# Patient Record
Sex: Female | Born: 1950 | Race: White | Hispanic: No | Marital: Married | Smoking: Never smoker
Health system: Southern US, Community
[De-identification: ages and names within clinical notes are randomized; demographics above are authoritative.]

## PROBLEM LIST (undated history)

## (undated) DIAGNOSIS — Z803 Family history of malignant neoplasm of breast: Secondary | ICD-10-CM

## (undated) DIAGNOSIS — Z8719 Personal history of other diseases of the digestive system: Secondary | ICD-10-CM

## (undated) DIAGNOSIS — C50919 Malignant neoplasm of unspecified site of unspecified female breast: Secondary | ICD-10-CM

## (undated) DIAGNOSIS — C50412 Malignant neoplasm of upper-outer quadrant of left female breast: Principal | ICD-10-CM

## (undated) DIAGNOSIS — N3010 Interstitial cystitis (chronic) without hematuria: Secondary | ICD-10-CM

## (undated) DIAGNOSIS — IMO0001 Reserved for inherently not codable concepts without codable children: Secondary | ICD-10-CM

## (undated) DIAGNOSIS — I10 Essential (primary) hypertension: Secondary | ICD-10-CM

## (undated) DIAGNOSIS — IMO0002 Reserved for concepts with insufficient information to code with codable children: Secondary | ICD-10-CM

## (undated) DIAGNOSIS — Z9889 Other specified postprocedural states: Secondary | ICD-10-CM

## (undated) DIAGNOSIS — R112 Nausea with vomiting, unspecified: Secondary | ICD-10-CM

## (undated) DIAGNOSIS — E785 Hyperlipidemia, unspecified: Secondary | ICD-10-CM

## (undated) HISTORY — PX: OTHER SURGICAL HISTORY: SHX169

## (undated) HISTORY — DX: Reserved for concepts with insufficient information to code with codable children: IMO0002

## (undated) HISTORY — DX: Malignant neoplasm of upper-outer quadrant of left female breast: C50.412

## (undated) HISTORY — DX: Hyperlipidemia, unspecified: E78.5

## (undated) HISTORY — PX: ABDOMINAL HYSTERECTOMY: SHX81

## (undated) HISTORY — DX: Family history of malignant neoplasm of breast: Z80.3

## (undated) HISTORY — PX: ESOPHAGOGASTRODUODENOSCOPY ENDOSCOPY: SHX5814

## (undated) HISTORY — DX: Malignant neoplasm of unspecified site of unspecified female breast: C50.919

## (undated) HISTORY — DX: Reserved for inherently not codable concepts without codable children: IMO0001

## (undated) HISTORY — PX: COLONOSCOPY W/ POLYPECTOMY: SHX1380

---

## 1998-05-25 ENCOUNTER — Other Ambulatory Visit: Admission: RE | Admit: 1998-05-25 | Discharge: 1998-05-25 | Payer: Self-pay | Admitting: Gynecology

## 1999-08-17 ENCOUNTER — Other Ambulatory Visit: Admission: RE | Admit: 1999-08-17 | Discharge: 1999-08-17 | Payer: Self-pay | Admitting: Gynecology

## 2000-08-05 ENCOUNTER — Other Ambulatory Visit: Admission: RE | Admit: 2000-08-05 | Discharge: 2000-08-05 | Payer: Self-pay | Admitting: Gynecology

## 2001-05-07 ENCOUNTER — Encounter: Payer: Self-pay | Admitting: Obstetrics and Gynecology

## 2001-05-07 ENCOUNTER — Encounter: Admission: RE | Admit: 2001-05-07 | Discharge: 2001-05-07 | Payer: Self-pay | Admitting: Obstetrics and Gynecology

## 2001-08-12 ENCOUNTER — Other Ambulatory Visit: Admission: RE | Admit: 2001-08-12 | Discharge: 2001-08-12 | Payer: Self-pay | Admitting: Gynecology

## 2001-11-27 ENCOUNTER — Encounter: Payer: Self-pay | Admitting: Gynecology

## 2001-12-02 ENCOUNTER — Inpatient Hospital Stay (HOSPITAL_COMMUNITY): Admission: RE | Admit: 2001-12-02 | Discharge: 2001-12-04 | Payer: Self-pay | Admitting: Gynecology

## 2004-08-18 ENCOUNTER — Ambulatory Visit (HOSPITAL_COMMUNITY): Admission: RE | Admit: 2004-08-18 | Discharge: 2004-08-18 | Payer: Self-pay | Admitting: Gastroenterology

## 2004-12-18 ENCOUNTER — Emergency Department (HOSPITAL_COMMUNITY): Admission: EM | Admit: 2004-12-18 | Discharge: 2004-12-18 | Payer: Self-pay | Admitting: Emergency Medicine

## 2005-03-08 ENCOUNTER — Other Ambulatory Visit: Admission: RE | Admit: 2005-03-08 | Discharge: 2005-03-08 | Payer: Self-pay | Admitting: Gynecology

## 2010-12-29 ENCOUNTER — Emergency Department (INDEPENDENT_AMBULATORY_CARE_PROVIDER_SITE_OTHER): Payer: 59

## 2010-12-29 ENCOUNTER — Emergency Department (HOSPITAL_BASED_OUTPATIENT_CLINIC_OR_DEPARTMENT_OTHER)
Admission: EM | Admit: 2010-12-29 | Discharge: 2010-12-30 | Disposition: A | Discharge status: Home / Self Care | Payer: 59 | Attending: Emergency Medicine | Admitting: Emergency Medicine

## 2010-12-29 DIAGNOSIS — W260XXA Contact with knife, initial encounter: Secondary | ICD-10-CM | POA: Insufficient documentation

## 2010-12-29 DIAGNOSIS — W261XXA Contact with sword or dagger, initial encounter: Secondary | ICD-10-CM | POA: Insufficient documentation

## 2010-12-29 DIAGNOSIS — E785 Hyperlipidemia, unspecified: Secondary | ICD-10-CM | POA: Insufficient documentation

## 2010-12-29 DIAGNOSIS — M79609 Pain in unspecified limb: Secondary | ICD-10-CM

## 2010-12-29 DIAGNOSIS — I10 Essential (primary) hypertension: Secondary | ICD-10-CM | POA: Insufficient documentation

## 2010-12-29 DIAGNOSIS — S61209A Unspecified open wound of unspecified finger without damage to nail, initial encounter: Secondary | ICD-10-CM | POA: Insufficient documentation

## 2010-12-29 DIAGNOSIS — Y92009 Unspecified place in unspecified non-institutional (private) residence as the place of occurrence of the external cause: Secondary | ICD-10-CM | POA: Insufficient documentation

## 2010-12-29 DIAGNOSIS — IMO0002 Reserved for concepts with insufficient information to code with codable children: Secondary | ICD-10-CM

## 2010-12-29 DIAGNOSIS — M7989 Other specified soft tissue disorders: Secondary | ICD-10-CM | POA: Insufficient documentation

## 2011-06-11 ENCOUNTER — Other Ambulatory Visit: Payer: Self-pay | Admitting: Gynecology

## 2012-08-26 ENCOUNTER — Other Ambulatory Visit: Payer: Self-pay | Admitting: Gastroenterology

## 2012-08-26 DIAGNOSIS — R1032 Left lower quadrant pain: Secondary | ICD-10-CM

## 2012-09-01 ENCOUNTER — Ambulatory Visit
Admission: RE | Admit: 2012-09-01 | Discharge: 2012-09-01 | Disposition: A | Discharge status: Home / Self Care | Payer: 59 | Source: Ambulatory Visit | Attending: Gastroenterology | Admitting: Gastroenterology

## 2012-09-01 DIAGNOSIS — R1032 Left lower quadrant pain: Secondary | ICD-10-CM

## 2012-09-01 MED ORDER — IOHEXOL 300 MG/ML  SOLN
100.0000 mL | Freq: Once | INTRAMUSCULAR | Status: AC | PRN
  Administered 2012-09-01: 100 mL via INTRAVENOUS

## 2014-08-26 ENCOUNTER — Emergency Department (HOSPITAL_COMMUNITY)
Admission: EM | Admit: 2014-08-26 | Discharge: 2014-08-26 | Disposition: A | Discharge status: Home / Self Care | Payer: 59 | Attending: Emergency Medicine | Admitting: Emergency Medicine

## 2014-08-26 ENCOUNTER — Encounter (HOSPITAL_COMMUNITY): Payer: Self-pay | Admitting: Emergency Medicine

## 2014-08-26 ENCOUNTER — Emergency Department (HOSPITAL_COMMUNITY): Payer: 59

## 2014-08-26 DIAGNOSIS — Z8742 Personal history of other diseases of the female genital tract: Secondary | ICD-10-CM | POA: Diagnosis not present

## 2014-08-26 DIAGNOSIS — I10 Essential (primary) hypertension: Secondary | ICD-10-CM | POA: Insufficient documentation

## 2014-08-26 DIAGNOSIS — R109 Unspecified abdominal pain: Secondary | ICD-10-CM | POA: Diagnosis present

## 2014-08-26 DIAGNOSIS — K5732 Diverticulitis of large intestine without perforation or abscess without bleeding: Secondary | ICD-10-CM | POA: Insufficient documentation

## 2014-08-26 DIAGNOSIS — Z90710 Acquired absence of both cervix and uterus: Secondary | ICD-10-CM | POA: Insufficient documentation

## 2014-08-26 HISTORY — DX: Essential (primary) hypertension: I10

## 2014-08-26 LAB — URINALYSIS, ROUTINE W REFLEX MICROSCOPIC
BILIRUBIN URINE: NEGATIVE
Glucose, UA: NEGATIVE mg/dL
Hgb urine dipstick: NEGATIVE
Ketones, ur: NEGATIVE mg/dL
Leukocytes, UA: NEGATIVE
Nitrite: NEGATIVE
PH: 5.5 (ref 5.0–8.0)
Protein, ur: NEGATIVE mg/dL
SPECIFIC GRAVITY, URINE: 1.018 (ref 1.005–1.030)
Urobilinogen, UA: 1 mg/dL (ref 0.0–1.0)

## 2014-08-26 LAB — CBC WITH DIFFERENTIAL/PLATELET
Basophils Absolute: 0 10*3/uL (ref 0.0–0.1)
Basophils Relative: 0 % (ref 0–1)
EOS ABS: 0 10*3/uL (ref 0.0–0.7)
EOS PCT: 0 % (ref 0–5)
HCT: 41.4 % (ref 36.0–46.0)
HEMOGLOBIN: 13.7 g/dL (ref 12.0–15.0)
LYMPHS PCT: 18 % (ref 12–46)
Lymphs Abs: 2.1 10*3/uL (ref 0.7–4.0)
MCH: 29 pg (ref 26.0–34.0)
MCHC: 33.1 g/dL (ref 30.0–36.0)
MCV: 87.7 fL (ref 78.0–100.0)
Monocytes Absolute: 1 10*3/uL (ref 0.1–1.0)
Monocytes Relative: 9 % (ref 3–12)
NEUTROS ABS: 8.1 10*3/uL — AB (ref 1.7–7.7)
Neutrophils Relative %: 73 % (ref 43–77)
Platelets: 249 10*3/uL (ref 150–400)
RBC: 4.72 MIL/uL (ref 3.87–5.11)
RDW: 13 % (ref 11.5–15.5)
WBC: 11.2 10*3/uL — ABNORMAL HIGH (ref 4.0–10.5)

## 2014-08-26 LAB — COMPREHENSIVE METABOLIC PANEL
ALT: 17 U/L (ref 0–35)
AST: 17 U/L (ref 0–37)
Albumin: 3.6 g/dL (ref 3.5–5.2)
Alkaline Phosphatase: 65 U/L (ref 39–117)
Anion gap: 7 (ref 5–15)
BUN: 12 mg/dL (ref 6–23)
CHLORIDE: 103 mmol/L (ref 96–112)
CO2: 29 mmol/L (ref 19–32)
Calcium: 8.8 mg/dL (ref 8.4–10.5)
Creatinine, Ser: 0.91 mg/dL (ref 0.50–1.10)
GFR, EST AFRICAN AMERICAN: 77 mL/min — AB (ref >90)
GFR, EST NON AFRICAN AMERICAN: 67 mL/min — AB (ref >90)
Glucose, Bld: 111 mg/dL — ABNORMAL HIGH (ref 70–99)
Potassium: 3.6 mmol/L (ref 3.5–5.1)
SODIUM: 139 mmol/L (ref 135–145)
Total Bilirubin: 1.5 mg/dL — ABNORMAL HIGH (ref 0.3–1.2)
Total Protein: 6.8 g/dL (ref 6.0–8.3)

## 2014-08-26 LAB — LIPASE, BLOOD: Lipase: 39 U/L (ref 11–59)

## 2014-08-26 MED ORDER — IOHEXOL 300 MG/ML  SOLN
80.0000 mL | Freq: Once | INTRAMUSCULAR | Status: AC | PRN
  Administered 2014-08-26: 80 mL via INTRAVENOUS

## 2014-08-26 MED ORDER — ONDANSETRON HCL 4 MG PO TABS: 4.0000 mg | ORAL_TABLET | Freq: Three times a day (TID) | ORAL | Status: DC | PRN

## 2014-08-26 MED ORDER — HYDROCODONE-ACETAMINOPHEN 5-325 MG PO TABS: 2.0000 | ORAL_TABLET | ORAL | Status: DC | PRN

## 2014-08-26 MED ORDER — ACETAMINOPHEN 325 MG PO TABS
650.0000 mg | ORAL_TABLET | Freq: Once | ORAL | Status: AC
  Administered 2014-08-26: 650 mg via ORAL
  Filled 2014-08-26: qty 2

## 2014-08-26 MED ORDER — METRONIDAZOLE 500 MG PO TABS
500.0000 mg | ORAL_TABLET | Freq: Once | ORAL | Status: AC
  Administered 2014-08-26: 500 mg via ORAL
  Filled 2014-08-26: qty 1

## 2014-08-26 MED ORDER — CIPROFLOXACIN HCL 500 MG PO TABS
500.0000 mg | ORAL_TABLET | Freq: Once | ORAL | Status: AC
  Administered 2014-08-26: 500 mg via ORAL
  Filled 2014-08-26: qty 1

## 2014-08-26 MED ORDER — CIPROFLOXACIN HCL 500 MG PO TABS: 500.0000 mg | ORAL_TABLET | Freq: Two times a day (BID) | ORAL | Status: AC

## 2014-08-26 MED ORDER — ONDANSETRON HCL 4 MG PO TABS: 4.0000 mg | ORAL_TABLET | Freq: Four times a day (QID) | ORAL | Status: DC

## 2014-08-26 MED ORDER — METRONIDAZOLE 500 MG PO TABS
500.0000 mg | ORAL_TABLET | Freq: Three times a day (TID) | ORAL | Status: DC
Start: 2014-08-26 — End: 2015-06-29

## 2014-08-26 MED ORDER — IOHEXOL 300 MG/ML  SOLN
25.0000 mL | INTRAMUSCULAR | Status: AC
  Administered 2014-08-26: 25 mL via ORAL

## 2014-08-26 NOTE — ED Notes (Signed)
Pt still not requesting pain medicine.  RN encouraged her to let us know if she changes her mind.

## 2014-08-26 NOTE — ED Notes (Signed)
Pt c/o RLQ pain x 3 days; pt sent from PCP pt had labs with her from PCP

## 2014-08-26 NOTE — ED Notes (Signed)
Pt sts she has a hx of diverticulitis, but only noted on left side in previous colonoscopy.

## 2014-08-26 NOTE — ED Notes (Signed)
Ct notified pt done with contrast.  Waiting on lab results, will come to pick up when resulted

## 2014-08-26 NOTE — ED Provider Notes (Signed)
CSN: [id number]     Arrival date & time 08/26/14  1621 History   First MD Initiated Contact with Patient 08/26/14 1900     Chief Complaint  Patient presents with  . Abdominal Pain     (Consider location/radiation/quality/duration/timing/severity/associated sxs/prior Treatment) HPI 64 year old female past history of total hysterectomy, interstitial cystitis, diverticulitis who presents to ED from clinic for further evaluation of 3 days of right lower quadrant pain. Patient reports pain has been intermittent over the past 2 days and then became constant. Patient reports pain is achy and rated as moderate. Palpation worsens pain and rest improves it. Pain is also located in the right mid and upper quadrants however her most significant pain is in the right lower quadrant. Pain seems to radiate to her back. She has taken nothing for treatment over-the-counter. Patient went to her PCP today who did labs which were notable for 13,000 white count. PCP was concerned about appendicitis and sent patient to ED for further evaluation. Patient denies fever, chills, urinary symptoms.    History reviewed. No pertinent past medical history. History reviewed. No pertinent past surgical history. History reviewed. No pertinent family history. History  Substance Use Topics  . Smoking status: Never Smoker   . Smokeless tobacco: Not on file  . Alcohol Use: Yes     Comment: occ   OB History    No data available     Review of Systems  Constitutional: Negative for fever and chills.  HENT: Negative for congestion, rhinorrhea and sore throat.   Eyes: Negative for visual disturbance.  Respiratory: Negative for cough and shortness of breath.   Cardiovascular: Negative for chest pain, palpitations and leg swelling.  Gastrointestinal: Positive for abdominal pain. Negative for nausea, vomiting, diarrhea and constipation.  Genitourinary: Negative for dysuria, hematuria, vaginal bleeding and vaginal discharge.   Musculoskeletal: Negative for back pain and neck pain.  Skin: Negative for rash.  Neurological: Negative for weakness and headaches.  All other systems reviewed and are negative.     Allergies  Sulfa antibiotics  Home Medications   Prior to Admission medications   Not on File   BP 144/81 mmHg  Pulse 94  Temp(Src) 98.2 F (36.8 C) (Oral)  Resp 20  Ht 5\' 8"  (1.727 m)  Wt 163 lb (73.936 kg)  BMI 24.79 kg/m2  SpO2 100% Physical Exam  Constitutional: She is oriented to person, place, and time. She appears well-developed and well-nourished. No distress.  HENT:  Head: Normocephalic and atraumatic.  Eyes: Conjunctivae are normal.  Neck: Normal range of motion.  Cardiovascular: Normal rate, regular rhythm, normal heart sounds and intact distal pulses.   No murmur heard. Pulmonary/Chest: Effort normal and breath sounds normal. No respiratory distress. She has no wheezes. She has no rales. She exhibits no tenderness.  Abdominal: Soft. Bowel sounds are normal. She exhibits no distension and no mass. There is tenderness in the right upper quadrant, right lower quadrant and periumbilical area. There is guarding and tenderness at McBurney's point. There is no rigidity, no rebound and negative Murphy's sign.  RLQ > RUQ  Musculoskeletal: Normal range of motion.  Neurological: She is alert and oriented to person, place, and time. No cranial nerve deficit.  Skin: Skin is warm and dry.  Psychiatric: She has a normal mood and affect.  Nursing note and vitals reviewed.   ED Course  Procedures (including critical care time) Labs Review Labs Reviewed  CBC WITH DIFFERENTIAL/PLATELET - Abnormal; Notable for the following:  WBC 11.2 (*)    Neutro Abs 8.1 (*)    All other components within normal limits  COMPREHENSIVE METABOLIC PANEL - Abnormal; Notable for the following:    Glucose, Bld 111 (*)    Total Bilirubin 1.5 (*)    GFR calc non Af Amer 67 (*)    GFR calc Af Amer 77 (*)     All other components within normal limits  URINE CULTURE  LIPASE, BLOOD  URINALYSIS, ROUTINE W REFLEX MICROSCOPIC    Imaging Review Ct Abdomen Pelvis W Contrast  08/26/2014   CLINICAL DATA:  Acute onset of upper abdominal pain and right-sided abdominal pain for 3 days. Initial encounter.  EXAM: CT ABDOMEN AND PELVIS WITH CONTRAST  TECHNIQUE: Multidetector CT imaging of the abdomen and pelvis was performed using the standard protocol following bolus administration of intravenous contrast.  CONTRAST:  32mL OMNIPAQUE IOHEXOL 300 MG/ML  SOLN  COMPARISON:  CT of the abdomen and pelvis from 09/11/2012  FINDINGS: The visualized lung bases are clear. There appears to be a 2.2 cm nodule at the left breast just above the level of the nipple. A tiny hiatal hernia is noted.  Scattered hypodensities are seen in the liver. Some of these appear to reflect cysts, though others are of only slightly decreased attenuation. As these appear stable in size from 2014, these likely reflect small hemangiomas. The spleen is unremarkable in appearance. The gallbladder is within normal limits. The pancreas and adrenal glands are unremarkable.  An apparent stable small 1.0 cm bilobed cyst is noted at the anterior aspect of the left kidney. The kidneys are otherwise unremarkable. Minimal nonspecific perinephric stranding is noted bilaterally. There is no evidence of hydronephrosis. No renal or ureteral stones are seen.  The small bowel is unremarkable in appearance. The stomach is within normal limits. No acute vascular abnormalities are seen. Minimal calcification is seen along the abdominal aorta and its branches.  The appendix is normal in caliber, without evidence for appendicitis.  Focal soft tissue inflammation is noted along the proximal transverse colon, with associated inflamed diverticulum and wall thickening, compatible with acute diverticulitis. Scattered diverticulosis involves the distal ascending, transverse, descending and  proximal sigmoid colon.  The bladder is mildly distended and grossly unremarkable. The patient is status post hysterectomy. No suspicious adnexal masses are seen. No inguinal lymphadenopathy is seen.  No acute osseous abnormalities are identified.  IMPRESSION: 1. Acute diverticulitis at the proximal transverse colon, with associated wall thickening and soft tissue inflammation. No evidence of perforation or abscess formation at this time. 2. 2.2 cm nodule at the left breast, about the 12 o'clock region. Would correlate with recent mammogram, and consider follow-up mammogram as deemed clinically appropriate. 3. Scattered diverticulosis along the distal ascending, transverse, descending and proximal sigmoid colon. 4. Stable hypodensities within the liver. These appear to reflect small hemangiomas and cysts. Stable small left renal bilobed cyst also seen. 5. Tiny hiatal hernia seen.   Electronically Signed   By: Garald Balding M.D.   On: 08/26/2014 21:22     EKG Interpretation None      MDM   Final diagnoses:  None    ALAIJAH GIBLER is a 64 y.o. female with H&P as above. On arrival patient is him an enema stable and in no apparent distress. Her abdominal exam is as above notable for tenderness to palpation in the right lower and right upper quadrants with more intense pain appreciated in the right lower quadrant. Patient will received screening  labs and CT of her abdomen for further evaluation. Patient declines pain or nausea medication at this time. Patient found to have uncomplicated diverticulitis without signs of perforation or abscess. She was given first dose of Flagyl and Cipro while in the ED. Repeat abdominal exam remains benign. Patient given prescriptions for antibiotics, pain and nausea medication as needed. She is advised follow closely with her PCP.    Clinical Impression: 1. Diverticulitis of large intestine without perforation or abscess without bleeding     Disposition:  Discharge  Condition: Good  I have discussed the results, Dx and Tx plan with the pt(& family if present). He/she/they expressed understanding and agree(s) with the plan. Discharge instructions discussed at great length. Strict return precautions discussed and pt &/or family have verbalized understanding of the instructions. No further questions at time of discharge.    Discharge Medication List as of 08/26/2014 10:09 PM    START taking these medications   Details  ciprofloxacin (CIPRO) 500 MG tablet Take 1 tablet (500 mg total) by mouth 2 (two) times daily., Starting 08/26/2014, Until Sun 09/05/14, Print    HYDROcodone-acetaminophen (NORCO/VICODIN) 5-325 MG per tablet Take 2 tablets by mouth every 4 (four) hours as needed., Starting 08/26/2014, Until Discontinued, Print    metroNIDAZOLE (FLAGYL) 500 MG tablet Take 1 tablet (500 mg total) by mouth 3 (three) times daily., Starting 08/26/2014, Until Discontinued, Print    ondansetron (ZOFRAN) 4 MG tablet Take 1 tablet (4 mg total) by mouth every 6 (six) hours., Starting 08/26/2014, Until Discontinued, Print        Follow Up: [facility]     [address] [place] [address] [phone]  Follow up with your primary doctor in next 2-3 days for recheck. If needed, To establish primary care, call above   Pt seen in conjunction with Dr. Corky Sox, [place] [facility] [facility] Resident - PGY-2     Kirstie Peri, MD 37/04/88 [id number]  Delora Fuel, MD 94/50/38 [id number]

## 2014-08-26 NOTE — Discharge Instructions (Signed)
Diverticulitis °Diverticulitis is when small pockets that have formed in your colon (large intestine) become infected or swollen. °HOME CARE °· Follow your doctor's instructions. °· Follow a special diet if told by your doctor. °· When you feel better, your doctor may tell you to change your diet. You may be told to eat a lot of fiber. Fruits and vegetables are good sources of fiber. Fiber makes it easier to poop (have bowel movements). °· Take supplements or probiotics as told by your doctor. °· Only take medicines as told by your doctor. °· Keep all follow-up visits with your doctor. °GET HELP IF: °· Your pain does not get better. °· You have a hard time eating food. °· You are not pooping like normal. °GET HELP RIGHT AWAY IF: °· Your pain gets worse. °· Your problems do not get better. °· Your problems suddenly get worse. °· You have a fever. °· You keep throwing up (vomiting). °· You have bloody or black, tarry poop (stool). °MAKE SURE YOU:  °· Understand these instructions. °· Will watch your condition. °· Will get help right away if you are not doing well or get worse. °Document Released: 12/26/2007 Document Revised: 07/14/2013 Document Reviewed: 06/03/2013 °ExitCare® Patient Information ©2015 [employer]. This information is not intended to replace advice given to you by your health care provider. Make sure you discuss any questions you have with your health care provider. ° °

## 2014-08-28 LAB — URINE CULTURE

## 2015-03-09 ENCOUNTER — Other Ambulatory Visit: Payer: Self-pay | Admitting: Gastroenterology

## 2015-06-21 ENCOUNTER — Other Ambulatory Visit: Payer: Self-pay | Admitting: Radiology

## 2015-06-23 ENCOUNTER — Telehealth: Payer: Self-pay | Admitting: *Deleted

## 2015-06-23 NOTE — Telephone Encounter (Signed)
Left vm for pt to return call regarding [place] on 06/29/15. Contact information provided.

## 2015-06-24 ENCOUNTER — Encounter: Payer: Self-pay | Admitting: *Deleted

## 2015-06-24 ENCOUNTER — Telehealth: Payer: Self-pay | Admitting: *Deleted

## 2015-06-24 DIAGNOSIS — C50412 Malignant neoplasm of upper-outer quadrant of left female breast: Secondary | ICD-10-CM

## 2015-06-24 HISTORY — DX: Malignant neoplasm of upper-outer quadrant of left female breast: C50.412

## 2015-06-24 NOTE — Telephone Encounter (Signed)
Confirmed [facility] for 06/29/15 at 0830 .  Instructions and contact information given.

## 2015-06-28 NOTE — Progress Notes (Addendum)
[name]  [phone] [phone]  Clinic New Consult Note   Patient Care Team: Leighton Ruff, MD as PCP - General (Family Medicine) Excell Seltzer, MD as Consulting Physician (General Surgery) Truitt Merle, MD as Consulting Physician (Hematology) Arloa Koh, MD as Consulting Physician (Radiation Oncology) Sylvan Cheese, NP as Nurse Practitioner (Hematology and Oncology) 06/29/2015  CHIEF COMPLAINTS/PURPOSE OF CONSULTATION:  Newly diagnosed left breast cancer   Oncology History   Breast cancer of upper-outer quadrant of left female breast [facility])   Staging form: Breast, AJCC 7th Edition     Clinical: Stage IIA (T2, N0, cM0(i+)) - Unsigned       Breast cancer of upper-outer quadrant of left female breast ([place])   06/21/2015 Initial Diagnosis Breast cancer of upper-outer quadrant of left female breast ([place])   06/21/2015 Initial Biopsy Left breast core needle biopsy showed invasive ductal carcinoma, grade 2, and DCIS   06/21/2015 Receptors her2 ER 100% positive, PR 100% positive, HER-2 negative, Ki-67 2%    Mammogram calcification on Mammogram, and a 3cm mass at 3:00 position of left breast on [place], axilla was negative on [place]     HISTORY OF PRESENTING ILLNESS:  Maria Larson 64 y.o. female is here because of her recently diagnosed left breast cancer. She is accompanied by her husband to our multidisciplinary breast clinic today.  The cancer was discovered by screening mammogram. She had ultrasound of breast for follow-up a left breast cyst 6 months ago which was negative except a 2.4 cm cyst. Her mammogram on 06/13/2015 showed a a new area of calcification and ultrasound showed 2.2 cm irregular solid mass in the left breast at 3 clock position, which is new and suspicious for malignancy. She underwent ultrasound-guided core needle biopsy on 06/21/2015 which showed grade 2 invasive ductal carcinoma. ER/PR 100% positive, HER-2  negative.  She had no palpable breast mass before the biopsy. She tolerated the biopsy well, no complications. She feels well overall, denies any significant pain, dyspnea, GI or other symptoms. She is a current Pharmacist, hospital, still works full-time, she is active, bike 3-4 time a week for 20 months.   She had and it etopical pregnancy, was not able to conceive after infertility workup and treatment. She lives with her husband, they have a adopted son who is 52.   MEDICAL HISTORY:  Past Medical History  Diagnosis Date  . Hypertension   . Breast cancer of upper-outer quadrant of left female breast ([place]) 06/24/2015  . Breast cancer of upper-outer quadrant of left female breast ([place]) 06/24/2015  . Breast cancer ([place])   . Hyperlipemia     SURGICAL HISTORY: Past Surgical History  Procedure Laterality Date  . Abdominal hysterectomy    . Laproscopy      Multiple laparoscopy for infertility issue    GYN HISTORY  Menarchal: 16 LMP: hysterectomy at 50  Contraceptive: none  HRT: yes, 14 years, stopped recently  G1P0:   SOCIAL HISTORY: Social History   Social History  . Marital Status: Married    Spouse Name: N/A  . Number of Children: One adopted sone who is 20   . Years of Education: N/A   Occupational History  . Kindergarten teacher   Social History Main Topics  . Smoking status: Never Smoker   . Smokeless tobacco: Not on file  . Alcohol Use: Yes     Comment: social   . Drug Use: No  . Sexual Activity: Not on file   Other Topics  Concern  . Not on file   Social History Narrative    FAMILY HISTORY: Family History  Problem Relation Age of Onset  . Lung cancer Father   . Breast cancer Paternal Aunt   . Colon cancer Paternal Aunt   . Breast cancer Paternal Aunt   . Breast cancer Paternal Aunt     ALLERGIES:  is allergic to sulfa antibiotics.  MEDICATIONS:  Current Outpatient Prescriptions  Medication Sig Dispense Refill  . aspirin 81 MG tablet Take 81 mg by mouth  daily.    . Calcium Carb-Cholecalciferol (CALCIUM 600+D) 600-800 MG-UNIT TABS Take 1 tablet by mouth daily.    Marland Kitchen conjugated estrogens (PREMARIN) vaginal cream Place 1 Applicatorful vaginally once a week.    . Meth-Hyo-M Bl-Na Phos-Ph Sal (URIBEL PO) Take 1 tablet by mouth 3 (three) times daily as needed.    . Multiple Vitamin (MULTIVITAMIN) tablet Take 1 tablet by mouth daily.    Marland Kitchen omeprazole (PRILOSEC) 20 MG capsule Take 20 mg by mouth daily.    . ramipril (ALTACE) 10 MG capsule Take 10 mg by mouth daily.    . rosuvastatin (CRESTOR) 10 MG tablet Take 10 mg by mouth daily.     No current facility-administered medications for this visit.    REVIEW OF SYSTEMS:   Constitutional: Denies fevers, chills or abnormal night sweats Eyes: Denies blurriness of vision, double vision or watery eyes Ears, nose, mouth, throat, and face: Denies mucositis or sore throat Respiratory: Denies cough, dyspnea or wheezes Cardiovascular: Denies palpitation, chest discomfort or lower extremity swelling Gastrointestinal:  Denies nausea, heartburn or change in bowel habits Skin: Denies abnormal skin rashes Lymphatics: Denies new lymphadenopathy or easy bruising Neurological:Denies numbness, tingling or new weaknesses Behavioral/Psych: Mood is stable, no new changes  All other systems were reviewed with the patient and are negative.  PHYSICAL EXAMINATION: ECOG PERFORMANCE STATUS: 0 - Asymptomatic  Filed Vitals:   06/29/15 0901  BP: 156/88  Pulse: 97  Temp: 97.9 F (36.6 C)  Resp: 18   Filed Weights   06/29/15 0901  Weight: 168 lb 4.8 oz (76.34 kg)    GENERAL:alert, no distress and comfortable SKIN: skin color, texture, turgor are normal, no rashes or significant lesions EYES: normal, conjunctiva are pink and non-injected, sclera clear OROPHARYNX:no exudate, no erythema and lips, buccal mucosa, and tongue normal  NECK: supple, thyroid normal size, non-tender, without nodularity LYMPH:  no palpable  lymphadenopathy in the cervical, axillary or inguinal LUNGS: clear to auscultation and percussion with normal breathing effort HEART: regular rate & rhythm and no murmurs and no lower extremity edema ABDOMEN:abdomen soft, non-tender and normal bowel sounds Musculoskeletal:no cyanosis of digits and no clubbing  PSYCH: alert & oriented x 3 with fluent speech NEURO: no focal motor/sensory deficits Breasts: Breast inspection showed them to be symmetrical with no nipple discharge. Palpation of the breasts and axilla revealed no obvious mass that I could appreciate.   LABORATORY DATA:  I have reviewed the data as listed Lab Results  Component Value Date   WBC 6.5 06/29/2015   HGB 14.5 06/29/2015   HCT 43.4 06/29/2015   MCV 88.6 06/29/2015   PLT 287 06/29/2015    Recent Labs  08/26/14 2000 06/29/15 0849  NA 139 141  K 3.6 4.1  CL 103  --   CO2 29 24  GLUCOSE 111* 114  BUN 12 17.5  CREATININE 0.91 0.9  CALCIUM 8.8 9.5  GFRNONAA 67*  --   GFRAA 77*  --  PROT 6.8 6.9  ALBUMIN 3.6 3.7  AST 17 21  ALT 17 20  ALKPHOS 65 70  BILITOT 1.5* 1.69*   PATHOLOGY REPORT: Diagnosis 06/21/2015     Breast, left, needle core biopsy - INVASIVE DUCTAL CARCINOMA. - DUCTAL CARCINOMA IN SITU. - SEE COMMENT. Microscopic Comment Although definitive grading of breast carcinoma is best done on excision, the features of the invasive tumor from the left needle core biopsy are compatible with a grade 2 breast carcinoma. Breast prognostic markers will be performed and reported in an addendum. Findings are called to [facility]) on 06/22/2015. Dr. Donato Heinz has seen this case in consultation with agreement. (RH:ecj 06/22/2015)  Results: IMMUNOHISTOCHEMICAL AND MORPHOMETRIC ANALYSIS PERFORMED MANUALLY Estrogen Receptor: 100%, POSITIVE, STRONG STAINING INTENSITY Progesterone Receptor: 100%, POSITIVE, STRONG STAINING INTENSITY Proliferation Marker Ki67:  2%  Results: HER2 - NEGATIVE RATIO OF HER2/CEP17 SIGNALS 1.27 AVERAGE HER2 COPY NUMBER PER CELL 2.80   RADIOGRAPHIC STUDIES: I have personally reviewed the radiological images as listed and agreed with the findings in the report. No results found.  ASSESSMENT & PLAN:  64 year old Caucasian postmenopausal female, with past medical history of hypertension,dyslipidemia, presented with screening discovered left breast cancer.  1. 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 -She is a candidate for breast conservation surgery. She was seen by breast surgeon Dr. Excell Seltzer today, who recommends lumpectomy. -Given her positive family history of breast cancer, we recommend her to undergo genetic testing to ruled out inheritable breast cancer -Given her strong ER and PR positivity, low Ki-67, she may not need adjuvant chemotherapy -I recommend further motor testing on her surgical tumor sample. If her final surgical path reveals negative lymph nodes, I recommend Oncotype DX test. If she has positive lymph nodes, I recommend mammaprint genomic testing, to everted her risk of cancer recurrence and benefit of adjuvant chemotherapy. Written material about Oncotype DX test will give into her to review.  -Given her strongly positive ER and PR, I do recommend antiestrogen therapy. She is postmenopausal, I would recommend a aromatase inhibitor for total 5-10 years. -She will likely benefit from breast radiation if she undergo lumpectomy to decrease the risk of breast cancer, she was seen by a radiation oncologist Dr. Valere Dross today -We discussed breast cancer surveillance after she completes treatment, Including annual mammogram, breast exam every 6-12 months. -the above was discussed with patient and her husband in  great details, she agrees with the plan.  2. Hypertension and dyslipidemia -She will continue follow-up with her permit care physician  Plan -genetic referral -She will likely have breast surgery soon  -I'll review her surgical pathology report. If node-negative, will sent Oncotype. If 1-2 nodes positive, we'll consider mammaprint  -I'll see her back in 3 weeks after her surgery to review her molecular testing results and determine on adjuvant chemotherapy   All questions were answered. The patient knows to call the clinic with any problems, questions or concerns. I spent 55 minutes counseling the patient face to face. The total time spent in the appointment was 60 minutes and more than 50% was on counseling.     Truitt Merle, MD 06/29/2015 2:13 PM

## 2015-06-29 ENCOUNTER — Other Ambulatory Visit: Payer: 59

## 2015-06-29 ENCOUNTER — Encounter: Payer: Self-pay | Admitting: Physical Therapy

## 2015-06-29 ENCOUNTER — Ambulatory Visit (HOSPITAL_BASED_OUTPATIENT_CLINIC_OR_DEPARTMENT_OTHER): Payer: 59 | Admitting: Hematology

## 2015-06-29 ENCOUNTER — Ambulatory Visit
Admission: RE | Admit: 2015-06-29 | Discharge: 2015-06-29 | Disposition: A | Discharge status: Home / Self Care | Payer: 59 | Source: Ambulatory Visit | Attending: Radiation Oncology | Admitting: Radiation Oncology

## 2015-06-29 ENCOUNTER — Encounter: Payer: Self-pay | Admitting: Skilled Nursing Facility1

## 2015-06-29 ENCOUNTER — Encounter: Payer: Self-pay | Admitting: Hematology

## 2015-06-29 ENCOUNTER — Ambulatory Visit: Payer: 59 | Attending: General Surgery | Admitting: Physical Therapy

## 2015-06-29 ENCOUNTER — Other Ambulatory Visit: Payer: Self-pay | Admitting: General Surgery

## 2015-06-29 ENCOUNTER — Encounter: Payer: Self-pay | Admitting: Nurse Practitioner

## 2015-06-29 ENCOUNTER — Other Ambulatory Visit (HOSPITAL_BASED_OUTPATIENT_CLINIC_OR_DEPARTMENT_OTHER): Payer: 59

## 2015-06-29 VITALS — BP 156/88 | HR 97 | Temp 97.9°F | Resp 18 | Ht 68.0 in | Wt 168.3 lb

## 2015-06-29 DIAGNOSIS — R293 Abnormal posture: Secondary | ICD-10-CM | POA: Insufficient documentation

## 2015-06-29 DIAGNOSIS — I10 Essential (primary) hypertension: Secondary | ICD-10-CM

## 2015-06-29 DIAGNOSIS — C50412 Malignant neoplasm of upper-outer quadrant of left female breast: Secondary | ICD-10-CM

## 2015-06-29 DIAGNOSIS — C50812 Malignant neoplasm of overlapping sites of left female breast: Secondary | ICD-10-CM

## 2015-06-29 DIAGNOSIS — C50912 Malignant neoplasm of unspecified site of left female breast: Secondary | ICD-10-CM

## 2015-06-29 DIAGNOSIS — E755 Other lipid storage disorders: Secondary | ICD-10-CM

## 2015-06-29 DIAGNOSIS — Z170 Estrogen receptor positive status [ER+]: Secondary | ICD-10-CM | POA: Diagnosis not present

## 2015-06-29 LAB — COMPREHENSIVE METABOLIC PANEL
ALT: 20 U/L (ref 0–55)
ANION GAP: 11 meq/L (ref 3–11)
AST: 21 U/L (ref 5–34)
Albumin: 3.7 g/dL (ref 3.5–5.0)
Alkaline Phosphatase: 70 U/L (ref 40–150)
BILIRUBIN TOTAL: 1.69 mg/dL — AB (ref 0.20–1.20)
BUN: 17.5 mg/dL (ref 7.0–26.0)
CALCIUM: 9.5 mg/dL (ref 8.4–10.4)
CHLORIDE: 107 meq/L (ref 98–109)
CO2: 24 mEq/L (ref 22–29)
CREATININE: 0.9 mg/dL (ref 0.6–1.1)
EGFR: 70 mL/min/{1.73_m2} — AB (ref >90)
Glucose: 114 mg/dl (ref 70–140)
Potassium: 4.1 mEq/L (ref 3.5–5.1)
Sodium: 141 mEq/L (ref 136–145)
Total Protein: 6.9 g/dL (ref 6.4–8.3)

## 2015-06-29 LAB — CBC WITH DIFFERENTIAL/PLATELET
BASO%: 0.5 % (ref 0.0–2.0)
BASOS ABS: 0 10*3/uL (ref 0.0–0.1)
EOS ABS: 0 10*3/uL (ref 0.0–0.5)
EOS%: 0.5 % (ref 0.0–7.0)
HEMATOCRIT: 43.4 % (ref 34.8–46.6)
HGB: 14.5 g/dL (ref 11.6–15.9)
LYMPH#: 1.6 10*3/uL (ref 0.9–3.3)
LYMPH%: 24.9 % (ref 14.0–49.7)
MCH: 29.6 pg (ref 25.1–34.0)
MCHC: 33.4 g/dL (ref 31.5–36.0)
MCV: 88.6 fL (ref 79.5–101.0)
MONO#: 0.5 10*3/uL (ref 0.1–0.9)
MONO%: 7.5 % (ref 0.0–14.0)
NEUT#: 4.3 10*3/uL (ref 1.5–6.5)
NEUT%: 66.6 % (ref 38.4–76.8)
PLATELETS: 287 10*3/uL (ref 145–400)
RBC: 4.9 10*6/uL (ref 3.70–5.45)
RDW: 13.3 % (ref 11.2–14.5)
WBC: 6.5 10*3/uL (ref 3.9–10.3)

## 2015-06-29 NOTE — Progress Notes (Addendum)
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 female who is  Seen today at the breast  multidisciplinary clinic through the courtesy of Dr. 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 At the time of a CT scan of her abdomen for evaluation of upper abdominal discomfort on 08/26/2014 she was felt to have a 2.2 cm nodule in the 12:00 position within the left breast. Follow-up mammography at [facility] on  09/07/2014 showed a 2.4 cm cyst within the left breast and follow-up mammography was recommended. Follow-up mammography on 06/13/2015 showed a 2.2 cm solid mass within left breast at 3:00 . Mammography showed an adjacent area of calcifications within left breast at 5:00, anterior depth. A biopsy on 06/21/2015 was diagnostic for invasive ductal carcinoma along with DCIS.  The invasive disease was felt to be grade 2.  The invasive disease was ER positive at 100%, PR positive at 100% with a Ki-67 of 2%.  HER-2/neu was negative.  Of note is that she does give a 13 year history of hormone replacement therapy which was just discontinued.  Her mother took DES during her pregnancy.  She is seen today with Dr. Excell Seltzer and Dr. Burr Medico.  PREVIOUS RADIATION THERAPY: No   PAST MEDICAL HISTORY:  has a past medical history of Hypertension; Breast cancer of upper-outer quadrant of left female breast ([place]) (06/24/2015); Breast cancer of upper-outer quadrant of left female breast ([place]) (06/24/2015); Breast cancer ([place]); and Hyperlipemia.     PAST SURGICAL HISTORY:  Past Surgical  History  Procedure Laterality Date  . Abdominal hysterectomy    . Laproscopy      Multiple laparoscopy for infertility issue     FAMILY HISTORY: family history includes Breast cancer in her paternal aunt, paternal aunt, and paternal aunt; Colon cancer in her paternal aunt; Lung cancer in her father.   Her father died of metastatic lung cancer at age 56.  Her mother died from complications of Alzheimer's disease at 35. 3 paternal aunts were diagnosed with breast cancer after age 20.   SOCIAL HISTORY:  reports that she has never smoked. She does not have any smokeless tobacco history on file. She reports that she drinks alcohol. She reports that she does not use illicit drugs.   Married , one adopted child.  She works as a Print production planner  and graduated from [employer]: Sulfa antibiotics   MEDICATIONS:  Current Outpatient Prescriptions  Medication Sig Dispense Refill  . aspirin 81 MG tablet Take 81 mg by mouth daily.    . Calcium Carb-Cholecalciferol (CALCIUM 600+D) 600-800 MG-UNIT TABS Take 1 tablet by mouth daily.    Marland Kitchen conjugated estrogens (PREMARIN) vaginal cream Place 1 Applicatorful vaginally once a week.    . Meth-Hyo-M Bl-Na Phos-Ph Sal (URIBEL PO) Take 1 tablet by mouth 3 (three) times daily as needed.    . Multiple Vitamin (MULTIVITAMIN) tablet Take 1 tablet by mouth daily.    Marland Kitchen omeprazole (PRILOSEC) 20 MG capsule Take 20 mg  by mouth daily.    . ramipril (ALTACE) 10 MG capsule Take 10 mg by mouth daily.    . rosuvastatin (CRESTOR) 10 MG tablet Take 10 mg by mouth daily.     No current facility-administered medications for this encounter.     REVIEW OF SYSTEMS:  Pertinent items are noted in HPI.    PHYSICAL EXAM:  Alert and oriented 64 year old female appearing younger than her stated age. Wt Readings from Last 3 Encounters:  06/29/15 168 lb 4.8 oz (76.34 kg)  08/26/14 163 lb (73.936 kg)   Temp Readings from Last 3 Encounters:  06/29/15 97.9 F (36.6 C) Oral   08/26/14 99.2 F (37.3 C) Oral   BP Readings from Last 3 Encounters:  06/29/15 156/88  08/26/14 138/71   Pulse Readings from Last 3 Encounters:  06/29/15 97  08/26/14 90    Head and neck examination: Grossly unremarkable.  Nodes: Without palpable cervical, supraclavicular, or axillary lymphadenopathy. Breast: there is a punctate biopsy wound at approximately 3:00 along the lateral aspect of the left breast.  No discreet masses are appreciated.  Right breast without masses or lesions.  Extremities: Without edema.    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 We discussed local management options which include mastectomy versus partial mastectomy along with a sentinel lymph node biopsy.  We discussed hypofractionated treatment versus standard fractionation. Considering her breast size she may or may not be a candidate for hypofractionated treatment.  We discussed the potential acute and late toxicities of radiation therapy.  We discussed deep deep inspiration breath-hold technology to avoid cardiac irradiation.  With her family history, she will undergo genetic counseling.  She will then decide on her choice of surgery.   PLAN:  As discussed above.  She can be seen by Dr. Gery Pray for postoperative follow-up.   I spent 30  minutes face to face with the patient and more than 50% of that time was spent in counseling and/or coordination of care.

## 2015-06-29 NOTE — Therapy (Signed)
[place], [place], [address] Phone: [phone]   Fax:  [phone]  Physical Therapy Evaluation  Patient Details  Name: Maria Larson MRN: [id number] Date of Birth: 1951-06-03 Referring Provider: Dr. Excell Seltzer  Encounter Date: 06/29/2015      PT End of Session - 06/29/15 1303    Visit Number 1   Number of Visits 1   PT Start Time 1100   PT Stop Time 1120  Also saw pt from 1150-1205   PT Time Calculation (min) 20 min   Activity Tolerance Patient tolerated treatment well   Behavior During Therapy [facility] for tasks assessed/performed      Past Medical History  Diagnosis Date  . Hypertension   . Breast cancer of upper-outer quadrant of left female breast ([place]) 06/24/2015  . Breast cancer of upper-outer quadrant of left female breast ([place]) 06/24/2015  . Breast cancer ([place])   . Hyperlipemia     Past Surgical History  Procedure Laterality Date  . Abdominal hysterectomy    . Laproscopy      Multiple laparoscopy for infertility issue    There were no vitals filed for this visit.  Visit Diagnosis:  Carcinoma of upper-outer quadrant of left female breast [facility]) - Plan: PT plan of care cert/re-cert  Abnormal posture - Plan: PT plan of care cert/re-cert      Subjective Assessment - 06/29/15 1256    Subjective Patient was seen today for a baseline assessment of her newly diagnosed left breast cancer.   Patient is accompained by: Family member   Pertinent History Patient was diagnosed on 06/13/15 with left invasive ductal carcinoma breast cancer located in the upper outer quadrant measuring 2.2 cm.  It is ER/PR positive and HER2 negative.   Patient Stated Goals Lymphedema risk reduction and post op shoulder ROM HEP   Currently in Pain? No/denies            [facility] PT Assessment - 06/29/15 0001    Assessment   Medical Diagnosis Left breast cancer   Referring Provider Dr. Excell Seltzer    Onset Date/Surgical Date 06/13/15   Hand Dominance Right   Prior Therapy none   Precautions   Precautions Other (comment)  Active breast cancer   Restrictions   Weight Bearing Restrictions No   Balance Screen   Has the patient fallen in the past 6 months No   Has the patient had a decrease in activity level because of a fear of falling?  No   Is the patient reluctant to leave their home because of a fear of falling?  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   Leisure She walks 3-4 x/week for 30 minutes and bikes twice a month   Cognition   Overall Cognitive Status Within Functional Limits for tasks assessed   Posture/Postural Control   Posture/Postural Control Postural limitations   Postural Limitations Rounded Shoulders;Forward head   ROM / Strength   AROM / PROM / Strength AROM;Strength   AROM   AROM Assessment Site Shoulder   Right/Left Shoulder Right;Left   Right Shoulder Extension 44 Degrees   Right Shoulder Flexion 140 Degrees   Right Shoulder ABduction 137 Degrees   Right Shoulder Internal Rotation  64 Degrees   Right Shoulder External Rotation 75 Degrees   Left Shoulder Extension 58 Degrees   Left Shoulder Flexion 141 Degrees   Left Shoulder ABduction 133 Degrees   Left Shoulder Internal Rotation 64 Degrees   Left Shoulder External Rotation 90 Degrees   Strength   Overall Strength Within functional limits for tasks performed           LYMPHEDEMA/ONCOLOGY QUESTIONNAIRE - 06/29/15 1301    Type   Cancer Type Left breast cancer   Lymphedema Assessments   Lymphedema Assessments Upper extremities   Right Upper Extremity Lymphedema   10 cm Proximal to Olecranon Process 25.9 cm   Olecranon Process 24.1 cm   10 cm  Proximal to Ulnar Styloid Process 22.6 cm   Just Proximal to Ulnar Styloid Process 15 cm   Across Hand at [employer] 18.8 cm   At [place] of 2nd Digit 5.5 cm   Left Upper Extremity Lymphedema   10 cm Proximal to Olecranon Process 27.6 cm   Olecranon Process 24.5 cm   10 cm Proximal to Ulnar Styloid Process 22.6 cm   Just Proximal to Ulnar Styloid Process 14.5 cm   Across Hand at [employer] 18.8 cm   At [place] of 2nd Digit 5.6 cm       Patient was instructed today in a home exercise program today for post op shoulder range of motion. These included active assist shoulder flexion in sitting, scapular retraction, wall walking with shoulder abduction, and hands behind head external rotation.  She was encouraged to do these twice a day, holding 3 seconds and repeating 5 times when permitted by her physician.           PT Education - 06/29/15 1302    Education provided Yes   Education Details Lymphedema risk reduction and post op shoulder ROM HEP   Person(s) Educated Patient;Spouse   Methods Explanation;Demonstration;Handout   Comprehension Verbalized understanding;Returned demonstration              Breast Clinic Goals - 06/29/15 1305    Patient will be able to verbalize understanding of pertinent lymphedema risk reduction practices relevant to her diagnosis specifically related to skin care.   Time 1   Period Days   Status Achieved   Patient will be able to return demonstrate and/or verbalize understanding of the post-op home exercise program related to regaining shoulder range of motion.   Time 1   Period Days   Status Achieved   Patient will be able to verbalize understanding of the importance of attending the postoperative After Breast Cancer Class for further lymphedema risk reduction education and therapeutic exercise.   Time 1   Period Days   Status Achieved              Plan - 06/29/15 1303    Clinical Impression Statement Patient was diagnosed on  06/13/15 with left invasive ductal carcinoma breast cancer located in the upper outer quadrant measuring 2.2 cm.  It is ER/PR positive and HER2 negative.  She is planning to have a left lumpectomy (pending genetic testing results) with a sentinel node biopsy followed by Oncotype testing, radiation and anti-estrogen therapy.  She may benefit from post op PT to regain shoulder ROM and strength.   Pt will benefit from skilled therapeutic intervention in order to improve on the following deficits Decreased strength;Pain;Decreased knowledge of precautions;Impaired UE functional use;Decreased range of motion   Rehab Potential Excellent   Clinical Impairments  Affecting Rehab Potential none   PT Frequency One time visit   PT Treatment/Interventions Therapeutic exercise;Patient/family education   Consulted and Agree with Plan of Care Patient;Family member/caregiver   Family Member Consulted Husband     Patient will follow up at outpatient cancer rehab if needed following surgery.  If the patient requires physical therapy at that time, a specific plan will be dictated and sent to the referring physician for approval. The patient was educated today on appropriate basic range of motion exercises to begin post operatively and the importance of attending the After Breast Cancer class following surgery.  Patient was educated today on lymphedema risk reduction practices as it pertains to recommendations that will benefit the patient immediately following surgery.  She verbalized good understanding.  No additional physical therapy is indicated at this time.       Problem List Patient Active Problem List   Diagnosis Date Noted  . Breast cancer of upper-outer quadrant of left female breast ([place]) 06/24/2015    Annia Friendly, PT 06/29/2015 1:12 PM  [place], [place], [address] Phone: [phone]   Fax:  [phone]  Name:  XYLA LEISNER MRN: [id number] Date of Birth: 01-26-1951

## 2015-06-29 NOTE — Progress Notes (Signed)
Maria Larson is a very pleasant 64 y.o. female from [place], [place] with newly diagnosed invasive ductal carcinoma and ductal carcinoma in situ of the left breast.  Biopsy results revealed the tumor's prognostic profile is ER positive, PR positive, and HER2/neu negative. Ki67 is 2%.  She presents today with her husband to the [facility]) for treatment consideration and recommendations from the breast surgeon, radiation oncologist, and medical oncologist.     I briefly met with Maria Larson and her husband during her [facility] visit today. We discussed the purpose of the Survivorship Clinic, which will include monitoring for recurrence, coordinating completion of age and gender-appropriate cancer screenings, promotion of overall wellness, as well as managing potential late/long-term side effects of anti-cancer treatments.    The treatment plan for Maria Larson will likely include surgery, radiation therapy, and anti-estrogen therapy.  She will meet with the Genetics Counselor due to her family history of breast cancer As of today, the intent of treatment for Maria Larson is cure, therefore she will be eligible for the Survivorship Clinic upon her completion of treatment.  Her survivorship care plan (SCP) document will be drafted and updated throughout the course of her treatment trajectory. She will receive the SCP in an office visit with myself in the Survivorship Clinic once she has completed treatment.   Maria Larson was encouraged to ask questions and all questions were answered to her satisfaction.  She was given my business card and encouraged to contact me with any concerns regarding survivorship.  I look forward to participating in her care.   Kenn File, [place] [phone]

## 2015-06-29 NOTE — Patient Instructions (Signed)

## 2015-06-29 NOTE — Progress Notes (Signed)
Subjective:     Patient ID: Maria Larson, female   DOB: 08-Dec-1950, 64 y.o.   MRN: [id number]  HPI   Review of Systems     Objective:   Physical Exam For the patient to understand and be given the tools to implement a healthy plant based diet during their cancer diagnosis.     Assessment:     Patient was seen today and found to be in good spirits and accompanied by her husband. Pts ht 5'8'', wt 168 pounds, and BMI 25.6. Pts medications: crestor, omeprezole, calcium, multivitamin. Pts labs: 1.69, GFR 70. Pt does currently eat meat but states she will try to make some changes.     Plan:     Dietitian educated the patient on implementing a plant based diet by incorporating more plant proteins, fruits, and vegetables. As a part of a healthy routine physical activity was discussed. The importance of legitimate, evidence based information was discussed and examples were given. A folder of evidence based information with a focus on a plant based diet and general nutrition during cancer was given to the patient.  As a part of the continuum of care the cancer dietitian's contact information was given to the patient in the event they would like to have a follow up appointment.

## 2015-07-04 ENCOUNTER — Encounter: Payer: Self-pay | Admitting: Genetic Counselor

## 2015-07-04 ENCOUNTER — Telehealth: Payer: Self-pay | Admitting: *Deleted

## 2015-07-04 ENCOUNTER — Ambulatory Visit (HOSPITAL_BASED_OUTPATIENT_CLINIC_OR_DEPARTMENT_OTHER): Payer: 59 | Admitting: Genetic Counselor

## 2015-07-04 ENCOUNTER — Other Ambulatory Visit: Payer: 59

## 2015-07-04 DIAGNOSIS — Z803 Family history of malignant neoplasm of breast: Secondary | ICD-10-CM

## 2015-07-04 DIAGNOSIS — C50412 Malignant neoplasm of upper-outer quadrant of left female breast: Secondary | ICD-10-CM

## 2015-07-04 DIAGNOSIS — Z315 Encounter for genetic counseling: Secondary | ICD-10-CM | POA: Diagnosis not present

## 2015-07-04 NOTE — Telephone Encounter (Signed)
Spoke to pt concerning [place] from 06/29/15. Denies question or concerns regarding dx or treatment care plan. Encourage pt to call with needs. Received verbal understanding. Pt did ask about cost of genetic counseling. Informed pt that our genetic counselors will discuss that prior to having lab drawn.

## 2015-07-04 NOTE — Progress Notes (Signed)
REFERRING PROVIDER: Leighton Ruff, MD [place],  [address]   Truitt Merle, MD  PRIMARY PROVIDER:  Gerrit Heck, MD  PRIMARY REASON FOR VISIT:  1. Breast cancer of upper-outer quadrant of left female breast ([place])   2. Family history of breast cancer      HISTORY OF PRESENT ILLNESS:   Ms. Heathcock, a 64 y.o. female, was seen for a [place] cancer genetics consultation at the request of Dr. Drema Dallas due to a personal and family history of cancer.  Ms. Stouffer presents to clinic today to discuss the possibility of a hereditary predisposition to cancer, genetic testing, and to further clarify her future cancer risks, as well as potential cancer risks for family members.   In 2016, at the age of 49, Ms. 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  The tumor is ER+/PR+/Her2-. This will be treated with based on her genetic testing, but is thought that she will have a lumpectomy and radiation.  At the age of 68, Ms. Northcraft had a hysterectomy. She was exposed inutero to DES.  Her mother reportedly was leery of the medication and therefore did not consistently take the medication, nor did she take the recommended does.  Ms. Busser states that she had cysts on her ovaries in her late 66s and something that they were testing for in her blood was rising.  Therefore she had a TAH/BSO, and took HRT until recently.    CANCER HISTORY:  Oncology History   Breast cancer of upper-outer quadrant of left female breast [facility])   Staging form: Breast, AJCC 7th Edition     Clinical: Stage IIA (T2, N0, cM0(i+)) - Unsigned       Breast cancer of upper-outer quadrant of left female breast ([place])   06/21/2015 Initial Diagnosis Breast cancer of upper-outer quadrant of left female breast ([place])   06/21/2015 Initial Biopsy Left breast core needle biopsy showed invasive ductal carcinoma, grade 2, and DCIS   06/21/2015 Receptors her2 ER 100% positive, PR 100% positive,  HER-2 negative, Ki-67 2%    Mammogram calcification on Mammogram, and a 3cm mass at 3:00 position of left breast on [place], axilla was negative on [place]      HORMONAL RISK FACTORS:  Menarche was at age 36.  First live birth at age N/A.  OCP use for approximately 0 years.  Ovaries intact: no  Hysterectomy: yes.  Menopausal status: postmenopausal.  HRT use: 14 years. Colonoscopy: yes; 1-2 polyps. Mammogram within the last year: yes. Number of breast biopsies: 1. Up to date with pelvic exams:  yes. Any excessive radiation exposure in the past:  no  Past Medical History  Diagnosis Date  . Hypertension   . Breast cancer of upper-outer quadrant of left female breast ([place]) 06/24/2015  . Breast cancer of upper-outer quadrant of left female breast ([place]) 06/24/2015  . Breast cancer ([place])   . Hyperlipemia   . Family history of breast cancer     Past Surgical History  Procedure Laterality Date  . Abdominal hysterectomy    . Laproscopy      Multiple laparoscopy for infertility issue    Social History   Social History  . Marital Status: Married    Spouse Name: Barbarann Ehlers  . Number of Children: 1  . Years of Education: N/A   Social History Main Topics  . Smoking status: Never Smoker   . Smokeless tobacco: None  . Alcohol Use: Yes  Comment: social   . Drug Use: No  . Sexual Activity: Not Asked   Other Topics Concern  . None   Social History Narrative     FAMILY HISTORY:  We obtained a detailed, 4-generation family history.  Significant diagnoses are listed below: Family History  Problem Relation Age of Onset  . Lung cancer Father   . Breast cancer Paternal Aunt     dx over 90  . Breast cancer Paternal Aunt     dx over 62s  . Breast cancer Paternal Aunt     dx over 74  . Kidney disease Maternal Grandfather   . Congestive Heart Failure Paternal Grandmother   . Colon cancer Paternal Aunt     dx in her 66s   The patient has an adopted son, and one full brother who are all  cancer free.  Her father had possibly lung cancer that metasticized to the bone.  Her mother died of Alzheimer's disease.  Her mother had two full sisters and a paternal half sister. There is no reported cancer on the maternal side of the family.  The patient's father had five sisters and three brothers.  Two brothers died in accidents, one died of old age, and three sisters had breast cancer, one of which went on to develop leukemia.  Another sister died from colon cancer.  There is no other reported family history of cancer on the paternal side.  Patient's maternal ancestors are of possibly [place] descent, and paternal ancestors are of English descent. There is no reported Ashkenazi Jewish ancestry. There is no known consanguinity.  GENETIC COUNSELING ASSESSMENT: EFFIE WAHLERT is a 64 y.o. female with a personal and family history of breast cancer which is somewhat suggestive of a hereditary cancer syndrome and predisposition to cancer. We, therefore, discussed and recommended the following at today's visit.   DISCUSSION: We discussed that about 5-10% of breast cancer is hereditary, with most being the result of BRCA mutations.  Other genes that can commonly have mutations include PALB2, ATM and CHEK2.  We reviewed the characteristics, features and inheritance patterns of hereditary cancer syndromes. We also discussed genetic testing, including the appropriate family members to test, the process of testing, insurance coverage and turn-around-time for results. We discussed the implications of a negative, positive and/or variant of uncertain significant result. We recommended Ms. Renwick pursue genetic testing for the Breast/Ovarian cancer gene panel.   Based on Ms. Cham's personal and family history of cancer, she meets medical criteria for genetic testing. Despite that she meets criteria, she may still have an out of pocket cost. We discussed that if her out of pocket cost for testing is over $100, the  laboratory will call and confirm whether she wants to proceed with testing.  If the out of pocket cost of testing is less than $100 she will be billed by the genetic testing laboratory.   PLAN: After considering the risks, benefits, and limitations, Ms. Brutus  provided informed consent to pursue genetic testing and the blood sample was sent to [facility] for analysis of the Breast/Ovarian cancer gene panel. Results should be available within approximately 2-3 weeks' time, at which point they will be disclosed by telephone to Ms. July, as will any additional recommendations warranted by these results. Ms. Lampert will receive a summary of her genetic counseling visit and a copy of her results once available. This information will also be available in Epic. We encouraged Ms. Stegenga to remain in contact with  cancer genetics annually so that we can continuously update the family history and inform her of any changes in cancer genetics and testing that may be of benefit for her family. Ms. Haub questions were answered to her satisfaction today. Our contact information was provided should additional questions or concerns arise.  Lastly, we encouraged Ms. Botts to remain in contact with cancer genetics annually so that we can continuously update the family history and inform her of any changes in cancer genetics and testing that may be of benefit for this family.   Ms.  Alleyne questions were answered to her satisfaction today. Our contact information was provided should additional questions or concerns arise. Thank you for the referral and allowing [place] to share in the care of your patient.   Janay Canan P. Florene Glen, [place], [facility] Certified Genetic Counselor [email or web address] phone: [phone]  The patient was seen for a total of 60 minutes in face-to-face genetic counseling.  This patient was discussed with Drs. Magrinat, Lindi Adie and/or Burr Medico who agrees with the above.     _______________________________________________________________________ For Office Staff:  Number of people involved in session: 2 Was an Intern/ student involved with case: no

## 2015-07-12 ENCOUNTER — Telehealth: Payer: Self-pay | Admitting: Genetic Counselor

## 2015-07-12 ENCOUNTER — Encounter: Payer: Self-pay | Admitting: Genetic Counselor

## 2015-07-12 DIAGNOSIS — Z1379 Encounter for other screening for genetic and chromosomal anomalies: Secondary | ICD-10-CM | POA: Insufficient documentation

## 2015-07-12 NOTE — Telephone Encounter (Signed)
LM on VM with good news.  Gave CB instructions. 

## 2015-07-13 ENCOUNTER — Ambulatory Visit: Payer: Self-pay | Admitting: Genetic Counselor

## 2015-07-13 ENCOUNTER — Telehealth: Payer: Self-pay | Admitting: Genetic Counselor

## 2015-07-13 ENCOUNTER — Other Ambulatory Visit: Payer: Self-pay | Admitting: *Deleted

## 2015-07-13 DIAGNOSIS — Z1379 Encounter for other screening for genetic and chromosomal anomalies: Secondary | ICD-10-CM

## 2015-07-13 DIAGNOSIS — C50412 Malignant neoplasm of upper-outer quadrant of left female breast: Secondary | ICD-10-CM

## 2015-07-13 DIAGNOSIS — Z803 Family history of malignant neoplasm of breast: Secondary | ICD-10-CM

## 2015-07-13 NOTE — Progress Notes (Signed)
HPI: [name] was previously seen in the [place] clinic due to a personal and family history of cancer and concerns regarding a hereditary predisposition to cancer. Please refer to our prior cancer genetics clinic note for more information regarding [name]'s medical, social and family histories, and our assessment and recommendations, at the time. [name] recent genetic test results were disclosed to her, as were recommendations warranted by these results. These results and recommendations are discussed in more detail below.  FAMILY HISTORY:  We obtained a detailed, 4-generation family history.  Significant diagnoses are listed below: Family History  Problem Relation Age of Onset  . Lung cancer Father   . Breast cancer Paternal Aunt     dx over 60  . Breast cancer Paternal Aunt     dx over 63s  . Breast cancer Paternal Aunt     dx over 64  . Kidney disease Maternal Grandfather   . Congestive Heart Failure Paternal Grandmother   . Colon cancer Paternal Aunt     dx in her 59s    The patient has an adopted son, and one full brother who are all cancer free. Her father had possibly lung cancer that metasticized to the bone. Her mother died of Alzheimer's disease. Her mother had two full sisters and a paternal half sister. There is no reported cancer on the maternal side of the family. The patient's father had five sisters and three brothers. Two brothers died in accidents, one died of old age, and three sisters had breast cancer, one of which went on to develop leukemia. Another sister died from colon cancer. There is no other reported family history of cancer on the paternal side. Patient's maternal ancestors are of possibly [place] descent, and paternal ancestors are of English descent. There is no reported Ashkenazi Jewish ancestry. There is no known consanguinity.  GENETIC TEST RESULTS: At the time of [name]'s visit, we recommended she pursue genetic testing  of the Breast/Ovarian cancer gene panel. The Breast/Ovarian gene panel offered by [facility] includes sequencing and rearrangement analysis for the following 20 genes:  ATM, BARD1, BRCA1, BRCA2, BRIP1, CDH1, CHEK2, EPCAM, FANCC, MLH1, MSH2, MSH6, NBN, PALB2, PMS2, PTEN, RAD51C, RAD51D, TP53, and XRCC2.   The report date is July 12, 2015.  Genetic testing was normal, and did not reveal a deleterious mutation in these genes. The test report has been scanned into EPIC and is located under the Molecular Pathology section of the Results Review tab.   We discussed with [name] that since the current genetic testing is not perfect, it is possible there may be a gene mutation in one of these genes that current testing cannot detect, but that chance is small. We also discussed, that it is possible that another gene that has not yet been discovered, or that we have not yet tested, is responsible for the cancer diagnoses in the family, and it is, therefore, important to remain in touch with cancer genetics in the future so that we can continue to offer [name] the most up to date genetic testing.   CANCER SCREENING RECOMMENDATIONS: This result is reassuring and indicates that [name] likely does not have an increased risk for a future cancer due to a mutation in one of these genes. This normal test also suggests that [name]'s cancer was most likely not due to an inherited predisposition associated with one of these genes.  Most cancers happen by chance and this negative test  suggests that her cancer falls into this category.  We, therefore, recommended she continue to follow the cancer management and screening guidelines provided by her oncology and primary healthcare provider.   RECOMMENDATIONS FOR FAMILY MEMBERS: Women in this family might be at some increased risk of developing cancer, over the general population risk, simply due to the family history of cancer. We recommended women in this family have a  yearly mammogram beginning at age 12, or 68 years younger than the earliest onset of cancer, an an annual clinical breast exam, and perform monthly breast self-exams. Women in this family should also have a gynecological exam as recommended by their primary provider. All family members should have a colonoscopy by age 109.  FOLLOW-UP: Lastly, we discussed with [name] that cancer genetics is a rapidly advancing field and it is possible that new genetic tests will be appropriate for her and/or her family members in the future. We encouraged her to remain in contact with cancer genetics on an annual basis so we can update her personal and family histories and let her know of advances in cancer genetics that may benefit this family.   Our contact number was provided. [name] questions were answered to her satisfaction, and she knows she is welcome to call us at anytime with additional questions or concerns.   Maria Kayser, MS, [facility] Certified Genetic Counselor [email or web address]

## 2015-07-13 NOTE — Telephone Encounter (Signed)
Revealed negative genetic testing on the breast/ovarian cancer panel.   

## 2015-07-15 ENCOUNTER — Telehealth: Payer: Self-pay | Admitting: Hematology

## 2015-07-15 NOTE — Telephone Encounter (Signed)
Spoke with patient re f/u for 1/25 @ 11 am.

## 2015-07-20 ENCOUNTER — Encounter (HOSPITAL_COMMUNITY)
Admission: RE | Admit: 2015-07-20 | Discharge: 2015-07-20 | Disposition: A | Discharge status: Home / Self Care | Payer: 59 | Source: Ambulatory Visit | Attending: General Surgery | Admitting: General Surgery

## 2015-07-20 ENCOUNTER — Encounter (HOSPITAL_COMMUNITY): Payer: Self-pay

## 2015-07-20 DIAGNOSIS — Z79899 Other long term (current) drug therapy: Secondary | ICD-10-CM | POA: Insufficient documentation

## 2015-07-20 DIAGNOSIS — Z01818 Encounter for other preprocedural examination: Secondary | ICD-10-CM | POA: Insufficient documentation

## 2015-07-20 DIAGNOSIS — I10 Essential (primary) hypertension: Secondary | ICD-10-CM | POA: Diagnosis not present

## 2015-07-20 DIAGNOSIS — Z01812 Encounter for preprocedural laboratory examination: Secondary | ICD-10-CM | POA: Diagnosis not present

## 2015-07-20 DIAGNOSIS — E785 Hyperlipidemia, unspecified: Secondary | ICD-10-CM | POA: Insufficient documentation

## 2015-07-20 DIAGNOSIS — Z7982 Long term (current) use of aspirin: Secondary | ICD-10-CM | POA: Insufficient documentation

## 2015-07-20 DIAGNOSIS — C50912 Malignant neoplasm of unspecified site of left female breast: Secondary | ICD-10-CM | POA: Insufficient documentation

## 2015-07-20 HISTORY — DX: Interstitial cystitis (chronic) without hematuria: N30.10

## 2015-07-20 HISTORY — DX: Other specified postprocedural states: Z98.890

## 2015-07-20 HISTORY — DX: Nausea with vomiting, unspecified: R11.2

## 2015-07-20 HISTORY — DX: Personal history of other diseases of the digestive system: Z87.19

## 2015-07-20 LAB — COMPREHENSIVE METABOLIC PANEL
ALBUMIN: 4 g/dL (ref 3.5–5.0)
ALT: 28 U/L (ref 14–54)
AST: 24 U/L (ref 15–41)
Alkaline Phosphatase: 65 U/L (ref 38–126)
Anion gap: 7 (ref 5–15)
BILIRUBIN TOTAL: 1.2 mg/dL (ref 0.3–1.2)
BUN: 13 mg/dL (ref 6–20)
CALCIUM: 9.2 mg/dL (ref 8.9–10.3)
CHLORIDE: 108 mmol/L (ref 101–111)
CO2: 26 mmol/L (ref 22–32)
CREATININE: 0.88 mg/dL (ref 0.44–1.00)
GFR calc Af Amer: >60 mL/min (ref >60)
GLUCOSE: 108 mg/dL — AB (ref 65–99)
Potassium: 4.1 mmol/L (ref 3.5–5.1)
Sodium: 141 mmol/L (ref 135–145)
TOTAL PROTEIN: 6.6 g/dL (ref 6.5–8.1)

## 2015-07-20 LAB — CBC
HEMATOCRIT: 45.3 % (ref 36.0–46.0)
Hemoglobin: 14.8 g/dL (ref 12.0–15.0)
MCH: 29.4 pg (ref 26.0–34.0)
MCHC: 32.7 g/dL (ref 30.0–36.0)
MCV: 90.1 fL (ref 78.0–100.0)
Platelets: 293 10*3/uL (ref 150–400)
RBC: 5.03 MIL/uL (ref 3.87–5.11)
RDW: 13 % (ref 11.5–15.5)
WBC: 6.4 10*3/uL (ref 4.0–10.5)

## 2015-07-20 NOTE — Progress Notes (Signed)
Anesthesia PAT Evaluation (patient requested for anesthesia concerns): Patient is a 64 year old female scheduled for radioactive seed guided left lumpectomy with axillary sentinel lymph node biopsy on 07/27/2015 by Dr. Excell Seltzer. Anesthesia type this posted as general. Seed is being placed 07/26/15.  History includes left breast cancer, post-operative N/V, HTN, HLD, interstitial cystitis, diverticulosis, hiatal hernia, hysterectomy. PCP is Dr. Leighton Ruff. HEM-ONC is Dr. Truitt Merle. RAD-ONC is Dr. Valere Dross.  PAT Vitals: BP 150/80, RR 18, HR 91, T 36.7, O2 sat 95%. Patient is a pleasant Caucasian female in NAD. Heart RRR, no murmur noted. Lungs clear. No ankle edema.   Meds include ASA 81mg , Calcium 600 +D, Prilosec, ramipril, Crestor.  07/20/15 EKG: NSR, minimal voltage criteria for LVH, may be normal variant.  Preoperative labs noted. Isolated elevated total bilirubin is now WNL.   She is anxious about upcoming surgery. She has had issues with post-operative n/v with her laparoscopy > 25 years ago and hysterectomy 13 years ago. Had to unexpectedly stay overnight after her laparoscopy due to N/V and prolonged sedative effects of anesthesia. She had an EGD by GI Dr. Oletta Lamas ~ 02/2015 without N/V but was slow to come out of anesthesia. She is also anxious about dye injection for SN mapping. We discussed patient's typically given Zofran pre-operatively but her anesthesiologist could alter or add agents on the day of surgery as felt indicated. Also after discussion with anesthesiologist Dr. Gifford Shave, discussed that dye injections typically well tolerated after IV sedation and pectoral nerve block. She was comfortable talking further with her assigned anesthesiologist on the day of surgery.    George [facility]/Anesthesiology Phone [phone] 07/20/2015 1:16 PM

## 2015-07-20 NOTE — Progress Notes (Signed)
PCP is Leighton Ruff  Patient denied having any acute cardiac or pulmonary issues  Patient seemed to be very nervous about having anesthesia and requested that she see someone with Anesthesia today because she had bad nausea and vomiting with her last surgery that was thirteen years ago. Will call Ebony Hail, [place] and inform her of patients request.

## 2015-07-20 NOTE — Pre-Procedure Instructions (Signed)
Maria Larson  07/20/2015     Your procedure is scheduled on : Wednesday July 27, 2015 at 8:30 AM.  Report to [facility] Admitting at 6:30 AM.  Call this number if you have problems the morning of surgery: [phone]    Remember:  Do not eat food or drink liquids after midnight.  Take these medicines the morning of surgery with A SIP OF WATER : Omeprazole (Prilosec), Uribel if needed   Stop taking any aspirin, vitamins, Calcium, herbal medications, etc on Friday December 30th    Please bring Healthcare POA paperwork the day of your surgery   Do not wear jewelry, make-up or nail polish.  Do not wear lotions, powders, or perfumes.  You may NOT wear deodorant.  Do not shave 48 hours prior to surgery.   Do not bring valuables to the hospital.  [facility] is not responsible for any belongings or valuables.  Contacts, dentures or bridgework may not be worn into surgery.  Leave your suitcase in the car.  After surgery it may be brought to your room.  For patients admitted to the hospital, discharge time will be determined by your treatment team.  Patients discharged the day of surgery will not be allowed to drive home.   Name and phone number of your driver:    Special instructions:  Shower using CHG soap the night before and the morning of your surgery  Please read over the following fact sheets that you were given. Pain Booklet, Coughing and Deep Breathing and Surgical Site Infection Prevention

## 2015-07-26 MED ORDER — CEFAZOLIN SODIUM-DEXTROSE 2-3 GM-% IV SOLR
2.0000 g | INTRAVENOUS | Status: AC
  Administered 2015-07-27: 2 g via INTRAVENOUS
  Filled 2015-07-26: qty 50

## 2015-07-26 MED ORDER — CHLORHEXIDINE GLUCONATE 4 % EX LIQD: 1.0000 "application " | Freq: Once | CUTANEOUS | Status: DC

## 2015-07-27 ENCOUNTER — Ambulatory Visit (HOSPITAL_COMMUNITY)
Admission: RE | Admit: 2015-07-27 | Discharge: 2015-07-27 | Disposition: A | Discharge status: Home / Self Care | Payer: 59 | Source: Ambulatory Visit | Attending: General Surgery | Admitting: General Surgery

## 2015-07-27 ENCOUNTER — Ambulatory Visit (HOSPITAL_COMMUNITY): Payer: 59 | Admitting: Vascular Surgery

## 2015-07-27 ENCOUNTER — Encounter (HOSPITAL_COMMUNITY)
Admission: RE | Disposition: A | Discharge status: Home / Self Care | Payer: Self-pay | Source: Ambulatory Visit | Attending: General Surgery

## 2015-07-27 ENCOUNTER — Encounter (HOSPITAL_COMMUNITY): Payer: Self-pay | Admitting: *Deleted

## 2015-07-27 ENCOUNTER — Ambulatory Visit (HOSPITAL_COMMUNITY): Payer: 59 | Admitting: Anesthesiology

## 2015-07-27 ENCOUNTER — Encounter (HOSPITAL_COMMUNITY)
Admission: RE | Admit: 2015-07-27 | Discharge: 2015-07-27 | Disposition: A | Discharge status: Home / Self Care | Payer: 59 | Source: Ambulatory Visit | Attending: General Surgery | Admitting: General Surgery

## 2015-07-27 DIAGNOSIS — C50911 Malignant neoplasm of unspecified site of right female breast: Secondary | ICD-10-CM | POA: Diagnosis present

## 2015-07-27 DIAGNOSIS — I10 Essential (primary) hypertension: Secondary | ICD-10-CM | POA: Diagnosis not present

## 2015-07-27 DIAGNOSIS — K219 Gastro-esophageal reflux disease without esophagitis: Secondary | ICD-10-CM | POA: Insufficient documentation

## 2015-07-27 DIAGNOSIS — Z170 Estrogen receptor positive status [ER+]: Secondary | ICD-10-CM | POA: Insufficient documentation

## 2015-07-27 DIAGNOSIS — C50412 Malignant neoplasm of upper-outer quadrant of left female breast: Secondary | ICD-10-CM | POA: Diagnosis not present

## 2015-07-27 DIAGNOSIS — C50912 Malignant neoplasm of unspecified site of left female breast: Secondary | ICD-10-CM

## 2015-07-27 HISTORY — PX: RADIOACTIVE SEED GUIDED PARTIAL MASTECTOMY WITH AXILLARY SENTINEL LYMPH NODE BIOPSY: SHX6520

## 2015-07-27 SURGERY — RADIOACTIVE SEED GUIDED PARTIAL MASTECTOMY WITH AXILLARY SENTINEL LYMPH NODE BIOPSY
Anesthesia: Regional | Site: Breast | Laterality: Left

## 2015-07-27 MED ORDER — SODIUM CHLORIDE 0.9 % IJ SOLN
INTRAMUSCULAR | Status: AC
  Filled 2015-07-27: qty 10

## 2015-07-27 MED ORDER — PROMETHAZINE HCL 25 MG/ML IJ SOLN
INTRAMUSCULAR | Status: AC
  Filled 2015-07-27: qty 1

## 2015-07-27 MED ORDER — MIDAZOLAM HCL 2 MG/2ML IJ SOLN
INTRAMUSCULAR | Status: AC
  Filled 2015-07-27: qty 2

## 2015-07-27 MED ORDER — EPHEDRINE SULFATE 50 MG/ML IJ SOLN
INTRAMUSCULAR | Status: AC
  Filled 2015-07-27: qty 1

## 2015-07-27 MED ORDER — SUCCINYLCHOLINE CHLORIDE 20 MG/ML IJ SOLN
INTRAMUSCULAR | Status: AC
  Filled 2015-07-27: qty 1

## 2015-07-27 MED ORDER — TECHNETIUM TC 99M SULFUR COLLOID FILTERED
1.0000 | Freq: Once | INTRAVENOUS | Status: AC | PRN
  Administered 2015-07-27: 1 via INTRADERMAL

## 2015-07-27 MED ORDER — HYDROCODONE-ACETAMINOPHEN 5-325 MG PO TABS: 1.0000 | ORAL_TABLET | ORAL | Status: DC | PRN

## 2015-07-27 MED ORDER — DEXAMETHASONE SODIUM PHOSPHATE 4 MG/ML IJ SOLN
INTRAMUSCULAR | Status: AC
  Filled 2015-07-27: qty 1

## 2015-07-27 MED ORDER — OXYCODONE HCL 5 MG PO TABS: 5.0000 mg | ORAL_TABLET | Freq: Once | ORAL | Status: DC | PRN

## 2015-07-27 MED ORDER — MIDAZOLAM HCL 5 MG/5ML IJ SOLN
INTRAMUSCULAR | Status: DC | PRN
  Administered 2015-07-27: 2 mg via INTRAVENOUS

## 2015-07-27 MED ORDER — ROCURONIUM BROMIDE 50 MG/5ML IV SOLN
INTRAVENOUS | Status: AC
  Filled 2015-07-27: qty 1

## 2015-07-27 MED ORDER — LIDOCAINE HCL (CARDIAC) 20 MG/ML IV SOLN
INTRAVENOUS | Status: AC
  Filled 2015-07-27: qty 5

## 2015-07-27 MED ORDER — GLYCOPYRROLATE 0.2 MG/ML IJ SOLN
INTRAMUSCULAR | Status: AC
  Filled 2015-07-27: qty 1

## 2015-07-27 MED ORDER — ONDANSETRON HCL 4 MG/2ML IJ SOLN
INTRAMUSCULAR | Status: DC | PRN
  Administered 2015-07-27: 4 mg via INTRAVENOUS

## 2015-07-27 MED ORDER — FENTANYL CITRATE (PF) 250 MCG/5ML IJ SOLN
INTRAMUSCULAR | Status: AC
  Filled 2015-07-27: qty 5

## 2015-07-27 MED ORDER — BUPIVACAINE-EPINEPHRINE 0.25% -1:200000 IJ SOLN
INTRAMUSCULAR | Status: DC | PRN
  Administered 2015-07-27: 10 mL

## 2015-07-27 MED ORDER — LACTATED RINGERS IV SOLN
INTRAVENOUS | Status: DC | PRN
  Administered 2015-07-27 (×2): via INTRAVENOUS

## 2015-07-27 MED ORDER — EPHEDRINE SULFATE 50 MG/ML IJ SOLN
INTRAMUSCULAR | Status: DC | PRN
  Administered 2015-07-27: 5 mg via INTRAVENOUS
  Administered 2015-07-27: 15 mg via INTRAVENOUS

## 2015-07-27 MED ORDER — PHENYLEPHRINE 40 MCG/ML (10ML) SYRINGE FOR IV PUSH (FOR BLOOD PRESSURE SUPPORT)
PREFILLED_SYRINGE | INTRAVENOUS | Status: AC
  Filled 2015-07-27: qty 10

## 2015-07-27 MED ORDER — BUPIVACAINE-EPINEPHRINE (PF) 0.25% -1:200000 IJ SOLN
INTRAMUSCULAR | Status: AC
  Filled 2015-07-27: qty 30

## 2015-07-27 MED ORDER — OXYCODONE HCL 5 MG/5ML PO SOLN: 5.0000 mg | Freq: Once | ORAL | Status: DC | PRN

## 2015-07-27 MED ORDER — ONDANSETRON HCL 4 MG/2ML IJ SOLN
INTRAMUSCULAR | Status: AC
  Filled 2015-07-27: qty 2

## 2015-07-27 MED ORDER — LIDOCAINE HCL (CARDIAC) 20 MG/ML IV SOLN
INTRAVENOUS | Status: DC | PRN
  Administered 2015-07-27: 60 mg via INTRAVENOUS

## 2015-07-27 MED ORDER — DEXAMETHASONE SODIUM PHOSPHATE 4 MG/ML IJ SOLN
INTRAMUSCULAR | Status: DC | PRN
  Administered 2015-07-27: 4 mg via INTRAVENOUS

## 2015-07-27 MED ORDER — SCOPOLAMINE 1 MG/3DAYS TD PT72
MEDICATED_PATCH | TRANSDERMAL | Status: DC | PRN
  Administered 2015-07-27: 1 via TRANSDERMAL

## 2015-07-27 MED ORDER — FENTANYL CITRATE (PF) 100 MCG/2ML IJ SOLN: 25.0000 ug | INTRAMUSCULAR | Status: DC | PRN

## 2015-07-27 MED ORDER — FENTANYL CITRATE (PF) 100 MCG/2ML IJ SOLN
INTRAMUSCULAR | Status: DC | PRN
  Administered 2015-07-27 (×5): 50 ug via INTRAVENOUS

## 2015-07-27 MED ORDER — PROPOFOL 10 MG/ML IV BOLUS
INTRAVENOUS | Status: AC
  Filled 2015-07-27: qty 40

## 2015-07-27 MED ORDER — ACETAMINOPHEN 325 MG PO TABS: 325.0000 mg | ORAL_TABLET | ORAL | Status: DC | PRN

## 2015-07-27 MED ORDER — BUPIVACAINE-EPINEPHRINE (PF) 0.5% -1:200000 IJ SOLN
INTRAMUSCULAR | Status: DC | PRN
  Administered 2015-07-27: 30 mL via PERINEURAL

## 2015-07-27 MED ORDER — ACETAMINOPHEN 160 MG/5ML PO SOLN
325.0000 mg | ORAL | Status: DC | PRN
  Filled 2015-07-27: qty 20.3

## 2015-07-27 MED ORDER — METHYLENE BLUE 1 % INJ SOLN
INTRAMUSCULAR | Status: AC
  Filled 2015-07-27: qty 10

## 2015-07-27 MED ORDER — PROPOFOL 10 MG/ML IV BOLUS
INTRAVENOUS | Status: DC | PRN
  Administered 2015-07-27: 60 mg via INTRAVENOUS
  Administered 2015-07-27: 20 mg via INTRAVENOUS
  Administered 2015-07-27: 30 mg via INTRAVENOUS
  Administered 2015-07-27: 170 mg via INTRAVENOUS

## 2015-07-27 MED ORDER — SCOPOLAMINE 1 MG/3DAYS TD PT72
MEDICATED_PATCH | TRANSDERMAL | Status: AC
  Filled 2015-07-27: qty 2

## 2015-07-27 MED ORDER — PROMETHAZINE HCL 25 MG/ML IJ SOLN
6.2500 mg | Freq: Once | INTRAMUSCULAR | Status: AC
  Administered 2015-07-27: 6.25 mg via INTRAVENOUS

## 2015-07-27 MED ORDER — 0.9 % SODIUM CHLORIDE (POUR BTL) OPTIME
TOPICAL | Status: DC | PRN
Start: 2015-07-27 — End: 2015-07-27
  Administered 2015-07-27: 1000 mL

## 2015-07-27 SURGICAL SUPPLY — 46 items
APPLIER CLIP 9.375 MED OPEN (MISCELLANEOUS) ×3
BINDER BREAST LRG (GAUZE/BANDAGES/DRESSINGS) IMPLANT
BINDER BREAST XLRG (GAUZE/BANDAGES/DRESSINGS) IMPLANT
BLADE SURG 15 STRL LF DISP TIS (BLADE) ×1 IMPLANT
BLADE SURG 15 STRL SS (BLADE) ×2
CANISTER SUCTION 2500CC (MISCELLANEOUS) ×3 IMPLANT
CHLORAPREP W/TINT 26ML (MISCELLANEOUS) ×3 IMPLANT
CLIP APPLIE 9.375 MED OPEN (MISCELLANEOUS) ×1 IMPLANT
CONT SPEC 4OZ CLIKSEAL STRL BL (MISCELLANEOUS) ×12 IMPLANT
COVER PROBE W GEL 5X96 (DRAPES) ×3 IMPLANT
COVER SURGICAL LIGHT HANDLE (MISCELLANEOUS) ×3 IMPLANT
DEVICE DUBIN SPECIMEN MAMMOGRA (MISCELLANEOUS) ×3 IMPLANT
DRAPE CHEST BREAST 15X10 FENES (DRAPES) ×3 IMPLANT
DRAPE UTILITY XL STRL (DRAPES) ×3 IMPLANT
ELECT COATED BLADE 2.86 ST (ELECTRODE) ×3 IMPLANT
ELECT REM PT RETURN 9FT ADLT (ELECTROSURGICAL) ×3
ELECTRODE REM PT RTRN 9FT ADLT (ELECTROSURGICAL) ×1 IMPLANT
GLOVE BIOGEL PI IND STRL 7.0 (GLOVE) ×1 IMPLANT
GLOVE BIOGEL PI IND STRL 8 (GLOVE) ×1 IMPLANT
GLOVE BIOGEL PI INDICATOR 7.0 (GLOVE) ×2
GLOVE BIOGEL PI INDICATOR 8 (GLOVE) ×2
GLOVE ECLIPSE 7.5 STRL STRAW (GLOVE) ×3 IMPLANT
GLOVE SURG SS PI 7.0 STRL IVOR (GLOVE) ×3 IMPLANT
GOWN STRL REUS W/ TWL LRG LVL3 (GOWN DISPOSABLE) ×1 IMPLANT
GOWN STRL REUS W/ TWL XL LVL3 (GOWN DISPOSABLE) ×1 IMPLANT
GOWN STRL REUS W/TWL LRG LVL3 (GOWN DISPOSABLE) ×2
GOWN STRL REUS W/TWL XL LVL3 (GOWN DISPOSABLE) ×3
KIT BASIN OR (CUSTOM PROCEDURE TRAY) ×3 IMPLANT
KIT MARKER MARGIN INK (KITS) ×3 IMPLANT
LIQUID BAND (GAUZE/BANDAGES/DRESSINGS) ×6 IMPLANT
NDL SAFETY ECLIPSE 18X1.5 (NEEDLE) IMPLANT
NEEDLE HYPO 18GX1.5 SHARP (NEEDLE)
NEEDLE HYPO 25X1 1.5 SAFETY (NEEDLE) ×3 IMPLANT
NS IRRIG 1000ML POUR BTL (IV SOLUTION) IMPLANT
PACK SURGICAL SETUP 50X90 (CUSTOM PROCEDURE TRAY) ×3 IMPLANT
PENCIL BUTTON HOLSTER BLD 10FT (ELECTRODE) ×3 IMPLANT
SPONGE LAP 18X18 X RAY DECT (DISPOSABLE) ×3 IMPLANT
SUT MON AB 5-0 PS2 18 (SUTURE) ×6 IMPLANT
SUT VIC AB 3-0 SH 18 (SUTURE) ×3 IMPLANT
SYR BULB 3OZ (MISCELLANEOUS) ×3 IMPLANT
SYR CONTROL 10ML LL (SYRINGE) ×3 IMPLANT
TOWEL OR 17X24 6PK STRL BLUE (TOWEL DISPOSABLE) ×3 IMPLANT
TOWEL OR 17X26 10 PK STRL BLUE (TOWEL DISPOSABLE) ×3 IMPLANT
TUBE CONNECTING 12'X1/4 (SUCTIONS) ×1
TUBE CONNECTING 12X1/4 (SUCTIONS) ×2 IMPLANT
YANKAUER SUCT BULB TIP NO VENT (SUCTIONS) ×3 IMPLANT

## 2015-07-27 NOTE — Anesthesia Preprocedure Evaluation (Addendum)
Anesthesia Evaluation  Patient identified by MRN, date of birth, ID band Patient awake    Reviewed: Allergy & Precautions, NPO status , Patient's Chart, lab work & pertinent test results  History of Anesthesia Complications (+) PONV and history of anesthetic complications  Airway Mallampati: II  TM Distance: >3 FB Neck ROM: Full    Dental no notable dental hx.    Pulmonary neg pulmonary ROS,    breath sounds clear to auscultation       Cardiovascular hypertension, Pt. on medications  Rhythm:Regular     Neuro/Psych negative neurological ROS  negative psych ROS   GI/Hepatic Neg liver ROS, hiatal hernia, GERD  Medicated and Controlled,  Endo/Other  negative endocrine ROS  Renal/GU negative Renal ROS     Musculoskeletal   Abdominal   Peds  Hematology negative hematology ROS (+)   Anesthesia Other Findings   Reproductive/Obstetrics                            Anesthesia Physical Anesthesia Plan  ASA: III  Anesthesia Plan: General and Regional   Post-op Pain Management:    Induction: Intravenous  Airway Management Planned: Oral ETT and LMA  Additional Equipment: None  Intra-op Plan:   Post-operative Plan: Extubation in OR  Informed Consent: I have reviewed the patients History and Physical, chart, labs and discussed the procedure including the risks, benefits and alternatives for the proposed anesthesia with the patient or authorized representative who has indicated his/her understanding and acceptance.   Dental advisory given  Plan Discussed with: CRNA and Surgeon  Anesthesia Plan Comments:        Anesthesia Quick Evaluation

## 2015-07-27 NOTE — Interval H&P Note (Signed)
History and Physical Interval Note:  07/27/2015 8:13 AM  Maria Larson  has presented today for surgery, with the diagnosis of cancer right breast  The various methods of treatment have been discussed with the patient and family. After consideration of risks, benefits and other options for treatment, the patient has consented to  Procedure(s): RADIOACTIVE SEED GUIDED LEFT LUMPECTOMY  WITH AXILLARY SENTINEL LYMPH NODE BIOPSY (Left) as a surgical intervention .  The patient's history has been reviewed, patient examined, no change in status, stable for surgery.  I have reviewed the patient's chart and labs.  Questions were answered to the patient's satisfaction.     Samina Weekes T

## 2015-07-27 NOTE — Transfer of Care (Signed)
Immediate Anesthesia Transfer of Care Note  Patient: Maria Larson  Procedure(s) Performed: Procedure(s): RADIOACTIVE SEED GUIDED LEFT LUMPECTOMY  WITH AXILLARY SENTINEL LYMPH NODE BIOPSY (Left)  Patient Location: PACU  Anesthesia Type:General and Regional  Level of Consciousness: awake, alert , oriented and sedated  Airway & Oxygen Therapy: Patient Spontanous Breathing and Patient connected to nasal cannula oxygen  Post-op Assessment: Report given to RN, Post -op Vital signs reviewed and stable and Patient moving all extremities  Post vital signs: Reviewed and stable  Last Vitals:  Filed Vitals:   07/27/15 0648 07/27/15 1025  BP: 194/107   Pulse: 90   Temp:  36.5 C  Resp: 18     Complications: No apparent anesthesia complications

## 2015-07-27 NOTE — H&P (Signed)
History of Present Illness Maria Kitchen T. [name] MD; 06/29/2015 12:13 PM) The patient is a 65 year old female who presents with breast cancer. Patint is a post menopausal female referred by Dr. Marcelo Larson for evaluation of recently diagnosed carcinoma of the left breast. She had a negative screening mamogram 1 year ago. She had a CT in Feb showing a nodule in her left breast and was referred for imaging. [place] at that time confirmed a benign appearing cyst in that area. Subsequent imaging included screening mamogram in Nov 2016 showing a new area of Ca++. Ultrasound showed a 2.2 cm irregular mass at the 3 o'clock position middle depth left breast . 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 She is seen now in [facility] for initial treatment planning. She has experienced no breast Sxs, specifically lump , pain, skin changes of nipple discharge. She does have a personal history of fibrocystic breast problems and cyst aspiration.  Findings at that time were the following: Tumor size: 2.2 cm Tumor grade: II Estrogen Receptor: Pos Progesterone Receptor: Pos Her-2 neu: Neg Lymph node status: Neg    Other Problems Maria Pummel, RN; 06/29/2015 8:25 AM) Breast Cancer Diverticulosis Gastroesophageal Reflux Disease High blood pressure Hypercholesterolemia Lump In Breast Oophorectomy Bilateral. Transfusion history Ventral Hernia Repair  Past Surgical History Maria Pummel, RN; 06/29/2015 8:25 AM) Breast Biopsy Left. Colon Polyp Removal - Colonoscopy Hysterectomy (not due to cancer) - Complete Oral Surgery  Diagnostic Studies History Maria Pummel, RN; 06/29/2015 8:25 AM) Colonoscopy 1-5 years ago Mammogram within last year Pap Smear 1-5 years ago  Social History Maria Pummel, RN; 06/29/2015 8:25 AM) Alcohol use Occasional alcohol use. No drug use Tobacco use Never  smoker.  Family History Maria Pummel, RN; 06/29/2015 8:25 AM) Breast Cancer Family Members In General. Cancer Father. Cerebrovascular Accident Mother. Colon Cancer Family Members In General. Colon Polyps Family Members In General. Diabetes Mellitus Brother, Father. Hypertension Mother. Kidney Disease Family Members In General. Thyroid problems Mother.  Pregnancy / Birth History Maria Pummel, RN; 06/29/2015 8:25 AM) Age at menarche 24 years. Age of menopause 55-50 Contraceptive History Intrauterine device, Oral contraceptives. Gravida 1 Irregular periods Para 0    Review of Systems Maria Spillers Ledford RN; 06/29/2015 8:25 AM) General Present- Night Sweats. Not Present- Appetite Loss, Chills, Fatigue, Fever, Weight Gain and Weight Loss. HEENT Present- Earache and Wears glasses/contact lenses. Not Present- Hearing Loss, Hoarseness, Nose Bleed, Oral Ulcers, Ringing in the Ears, Seasonal Allergies, Sinus Pain, Sore Throat, Visual Disturbances and Yellow Eyes. Respiratory Present- Snoring. Not Present- Bloody sputum, Chronic Cough, Difficulty Breathing and Wheezing. Breast Present- Breast Mass. Not Present- Breast Pain, Nipple Discharge and Skin Changes. Cardiovascular Not Present- Chest Pain, Difficulty Breathing Lying Down, Leg Cramps, Palpitations, Rapid Heart Rate, Shortness of Breath and Swelling of Extremities. Gastrointestinal Present- Abdominal Pain and Bloating. Not Present- Bloody Stool, Change in Bowel Habits, Chronic diarrhea, Constipation, Difficulty Swallowing, Excessive gas, Gets full quickly at meals, Hemorrhoids, Indigestion, Nausea, Rectal Pain and Vomiting. Female Genitourinary Not Present- Frequency, Nocturia, Painful Urination, Pelvic Pain and Urgency. Musculoskeletal Not Present- Back Pain, Joint Pain, Joint Stiffness, Muscle Pain, Muscle Weakness and Swelling of Extremities. Neurological Not Present- Decreased Memory, Fainting, Headaches, Numbness,  Seizures, Tingling, Tremor, Trouble walking and Weakness. Psychiatric Not Present- Anxiety, Bipolar, Change in Sleep Pattern, Depression, Fearful and Frequent crying. Endocrine Present- Hot flashes. Not Present- Cold Intolerance, Excessive Hunger, Hair Changes, Heat Intolerance and New Diabetes. Hematology Not  Present- Easy Bruising, Excessive bleeding, Gland problems, HIV and Persistent Infections.   Physical Exam Maria Kitchen T. Marveline Profeta MD; 06/29/2015 12:14 PM) The physical exam findings are as follows: Note:General: Alert, well-developed and well nourished Caucasian female, in no distress Skin: Warm and dry without rash or infection. HEENT: No palpable masses or thyromegaly. Sclera nonicteric. Pupils equal round and reactive. Breasts; No palpaable masses in either breast, no skin changes r nipple inversion Lymph nodes: No cervical, supraclavicular, or inguinal nodes palpable. Lungs: Breath sounds clear and equal. No wheezing or increased work of breathing. Cardiovascular: Regular rate and rhythm without murmer. No JVD or edema. Peripheral pulses intact. No carotid bruits. Abdomen: Nondistended. Soft and nontender. No masses palpable. No organomegaly. No palpable hernias. Extremities: No edema or joint swelling or deformity. No chronic venous stasis changes. Neurologic: Alert and fully oriented. Gait normal. No focal weakness. Psychiatric: Normal mood and affect. Thought content appropriate with normal judgement and insight    Assessment & Plan Maria Kitchen T. July Nickson MD; 06/29/2015 12:20 PM) BREAST CANCER, STAGE 1, LEFT (C50.912) Impression: 65 year old female with a new diagnosis of cancer of the left breast, upper outer quadrant. Clinical stage 1, ER Pos, PR Pos, HER-2 Neg. I discussed with the patient and family members present today initial surgical treatment options. We discussed options of breast conservation with lumpectomy or total mastectomy and sentinal lymph node biopsy/dissection.  Options for reconstruction were discussed. After discussion they have elected to proceed with lumpectomy and sentinal LN Bx. She will need genetic testing due to 3 paternal aunts with breast cancer. We will plan to get these results back before proceeding with surgery. We discussed the indications and nature of the procedure, and expected recovery, in detail. Surgical risks including anesthetic complications, cardiorespiratory complications, bleeding, infection, wound healing complications, blood clots, lymphedema, local and distant recurrence and possible need for further surgery based on the final pathology was discussed and understood. Chemotherapy, hormonal therapy and radiation therapy have been discussed. They have been provided with literature regarding the treatment of breast cancer. All questions were answered. They understand and agree to proceed and we will go ahead with scheduling.  Current Plans Referred to Genetic Counseling, for evaluation and follow up (Medical Genetics). Routine. Schedule for Surgery Left breast RSl lumpectomy and axillary SLN Bx pending genetic test results  Pt Education - [facility] Breast Biopsy HCI: discussed with patient and provided information.

## 2015-07-27 NOTE — Anesthesia Procedure Notes (Addendum)
Procedure Name: LMA Insertion Date/Time: 07/27/2015 7:54 AM Performed by: Scheryl Darter Pre-anesthesia Checklist: Patient identified, Emergency Drugs available, Suction available, Patient being monitored and Timeout performed Patient Re-evaluated:Patient Re-evaluated prior to inductionOxygen Delivery Method: Circle system utilized Preoxygenation: Pre-oxygenation with 100% oxygen Intubation Type: IV induction Ventilation: Mask ventilation without difficulty LMA: LMA inserted LMA Size: 4.0 Number of attempts: 1 Placement Confirmation: positive ETCO2 and breath sounds checked- equal and bilateral Tube secured with: Tape Dental Injury: Teeth and Oropharynx as per pre-operative assessment    Anesthesia Regional Block:  Pectoralis block  Pre-Anesthetic Checklist: ,, timeout performed, Correct Patient, Correct Site, Correct Laterality, Correct Procedure, Correct Position, site marked, Risks and benefits discussed,  Surgical consent,  Pre-op evaluation,  At surgeon's request and post-op pain management  Laterality: Left  Prep: chloraprep       Needles:  Injection technique: Single-shot  Needle Type: Echogenic Stimulator Needle          Additional Needles:  Procedures: ultrasound guided (picture in chart) Pectoralis block Narrative:  Injection made incrementally with aspirations every 5 mL.  Performed by: Personally  Anesthesiologist: Vincient Vanaman  Additional Notes: H+P and labs reviewed, risks and benefits discussed with patient, procedure tolerated well without complications

## 2015-07-27 NOTE — Discharge Instructions (Signed)
[facility] °Office Phone Number [phone] ° °BREAST BIOPSY/ PARTIAL MASTECTOMY: POST OP INSTRUCTIONS ° °Always review your discharge instruction sheet given to you by the facility where your surgery was performed. ° °IF YOU HAVE DISABILITY OR FAMILY LEAVE FORMS, YOU MUST BRING THEM TO THE OFFICE FOR PROCESSING.  DO NOT GIVE THEM TO YOUR DOCTOR. ° °1. A prescription for pain medication may be given to you upon discharge.  Take your pain medication as prescribed, if needed.  If narcotic pain medicine is not needed, then you may take acetaminophen (Tylenol) or ibuprofen (Advil) as needed. °2. Take your usually prescribed medications unless otherwise directed °3. If you need a refill on your pain medication, please contact your pharmacy.  They will contact our office to request authorization.  Prescriptions will not be filled after 5pm or on week-ends. °4. You should eat very light the first 24 hours after surgery, such as soup, crackers, pudding, etc.  Resume your normal diet the day after surgery. °5. Most patients will experience some swelling and bruising in the breast.  Ice packs and a good support bra will help.  Swelling and bruising can take several days to resolve.  °6. It is common to experience some constipation if taking pain medication after surgery.  Increasing fluid intake and taking a stool softener will usually help or prevent this problem from occurring.  A mild laxative (Milk of Magnesia or Miralax) should be taken according to package directions if there are no bowel movements after 48 hours. °7. Unless discharge instructions indicate otherwise, you may remove your bandages 24-48 hours after surgery, and you may shower at that time.  You may have steri-strips (small skin tapes) in place directly over the incision.  These strips should be left on the skin for 7-10 days.  If your surgeon used skin glue on the incision, you may shower in 24 hours.  The glue will flake off over the  next 2-3 weeks.  Any sutures or staples will be removed at the office during your follow-up visit. °8. ACTIVITIES:  You may resume regular daily activities (gradually increasing) beginning the next day.  Wearing a good support bra or sports bra minimizes pain and swelling.  You may have sexual intercourse when it is comfortable. °a. You may drive when you no longer are taking prescription pain medication, you can comfortably wear a seatbelt, and you can safely maneuver your car and apply brakes. °b. RETURN TO WORK:  ______________________________________________________________________________________ °9. You should see your doctor in the office for a follow-up appointment approximately two weeks after your surgery.  Your doctor’s nurse will typically make your follow-up appointment when she calls you with your pathology report.  Expect your pathology report 2-3 business days after your surgery.  You may call to check if you do not hear from us after three days. °10. OTHER INSTRUCTIONS: _______________________________________________________________________________________________ _____________________________________________________________________________________________________________________________________ °_____________________________________________________________________________________________________________________________________ °_____________________________________________________________________________________________________________________________________ ° °WHEN TO CALL YOUR DOCTOR: °1. Fever over 101.0 °2. Nausea and/or vomiting. °3. Extreme swelling or bruising. °4. Continued bleeding from incision. °5. Increased pain, redness, or drainage from the incision. ° °The clinic staff is available to answer your questions during regular business hours.  Please don’t hesitate to call and ask to speak to one of the nurses for clinical concerns.  If you have a medical emergency, go to the nearest  emergency room or call 911.  A surgeon from [facility] is always on call at the hospital. ° °For further questions, please visit [email or web address]  °

## 2015-07-27 NOTE — Op Note (Signed)
Preoperative Diagnosis: cancer right breast  Postoprative Diagnosis: cancer right breast  Procedure: Procedure(s): RADIOACTIVE SEED GUIDED LEFT LUMPECTOMY  WITH AXILLARY SENTINEL LYMPH NODE BIOPSY   Surgeon: Excell Seltzer T   Assistants: none  Anesthesia:  General LMA anesthesia  Indications: patient is a 65 year old female with a recent diagnosis of invasive and in situ cancer upper outer left breast, stage I, clinical T1b N0 M0. After extensive preoperative workup and discussion detailed elsewhere we have elected to proceed with radioactive seed localized left breast lumpectomy and left axillary sentinel lymph node biopsy as initial surgical therapy.    Procedure Detail:  Patient had previously undergone accurate placement of radioactive seed at the tumor and clip site in the lateral left breast. The seed placement was confirmed in the holding area. In the holding area she underwent a pectoral block and underwent injection of 1 mCi of technetium sulfur colloid intradermally around the left nipple.  She was then taken to the operating room, placed in the supine position on the operating table, a laryngeal mask general anesthesia induced. She received preoperative IV antibiotics.  PAS were in place. The left breast and axilla and upper arm were widely sterilely prepped and draped. Patient timeout was performed and correct procedure verified.  The lumpectomy was approached initially. The seed placement was confirmed that I made a curvilinear incision at the areolar border laterally in the left breast. Dissection was carried down in the subcutaneous tissue.  Short skin and subcutaneous flaps were raised. Using the neoprobe for guidance I excised a generous portion of tissue around the seed. There was noted to be some fairly extensive fibrocystic disease and blue dome cysts in the area. An approximately 3.5 cm area of breast tissue was excised. This was oriented with ink. Specimen mammogram was  obtained in 2 views which showed the radioactive seed and clip centrally located within the specimen although very slightly closer to the inferior and deep margin. I excised about a 1/2 cm furtherinferior and deep margin which was oriented and sent for permanent pathology. Hemostasis was obtained. The lumpectomy cavity was marked with clips. The breast and subcutaneous tissue was closed with interrupted 3-0 Vicryl. Attention was turned to the sentinel lymph node biopsy. A hot area in the left axilla was identified and a small transverse incision made. Dissection was carried down through the subcutaneous tissue and the clavipectoral fascia was incised. Using the neoprobe for guidance with blunt dissection I dissected down onto a soft slightly enlarged lymph node with very high counts. This was excised with cautery and ex vivo had counts of approximately 1200. There was some background count of about 50-60 and I did dissect out another area and excise this but counts were  Minimal. The background counts appeared diffuse throughout the axilla and again very low at about 50 and I did not excise any further tissue. hhemostasis was assured. The deep and simultaneous tissue was closed with interrupted 3-0 Vicryl. Both skin incisions were closed with subcuticular 5-0 Monocryl and Dermabond.  ponge needle and instrument counts were correct.    Findings: As above  Estimated Blood Loss:  Minimal         Drains: none  Blood Given: none          Specimens: #1 left breast lumpectomy          #2 further inferior margin     # 3 further deep margin        #4 left axillary sentinel lymph node     #  5 additional left axillary tissue        Complications:  * No complications entered in OR log *         Disposition: PACU - hemodynamically stable.         Condition: stable

## 2015-07-28 ENCOUNTER — Encounter (HOSPITAL_COMMUNITY): Payer: Self-pay | Admitting: General Surgery

## 2015-07-28 NOTE — Anesthesia Postprocedure Evaluation (Signed)
Anesthesia Post Note  Patient: Maria Larson  Procedure(s) Performed: Procedure(s) (LRB): RADIOACTIVE SEED GUIDED LEFT LUMPECTOMY  WITH AXILLARY SENTINEL LYMPH NODE BIOPSY (Left)  Patient location during evaluation: PACU Anesthesia Type: General and Regional Level of consciousness: awake Pain management: pain level controlled Vital Signs Assessment: post-procedure vital signs reviewed and stable Respiratory status: spontaneous breathing Cardiovascular status: stable Postop Assessment: no signs of nausea or vomiting Anesthetic complications: no    Last Vitals:  Filed Vitals:   07/27/15 1300 07/27/15 1315  BP:    Pulse: 79 81  Temp:    Resp:      Last Pain:  Filed Vitals:   07/27/15 1333  PainSc: 0-No pain                 Jonah Gingras

## 2015-07-29 ENCOUNTER — Telehealth: Payer: Self-pay | Admitting: *Deleted

## 2015-07-29 NOTE — Telephone Encounter (Signed)
Received order per Dr. Feng for oncotype testing. Requisition sent to pathology. Received by Christy. 

## 2015-08-08 ENCOUNTER — Encounter (HOSPITAL_COMMUNITY): Payer: Self-pay

## 2015-08-08 ENCOUNTER — Telehealth: Payer: Self-pay | Admitting: *Deleted

## 2015-08-08 ENCOUNTER — Telehealth: Payer: Self-pay | Admitting: Hematology

## 2015-08-08 NOTE — Telephone Encounter (Signed)
Her Oncotype DX test result came back today. The recurrence score is 4,  Low risk. I called patient and reviewed the results with her. She does not need adjuvant chemotherapy. She will see me on  08/17/2015  For follow-up. We have made referral for her to see radiation oncologist Dr. Sondra Come to proceed with breast radiation.  Truitt Merle  08/08/2015

## 2015-08-08 NOTE — Telephone Encounter (Signed)
Received Oncotype Score of 4. Physician team notified.

## 2015-08-12 NOTE — Progress Notes (Signed)
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 Breast, lumpectomy, Left - INVASIVE DUCTAL CARCINOMA, GRADE 1/3, SPANNING 1.8 CM. - DUCTAL CARCINOMA IN SITU, LOW GRADE. - LYMPHOVASCULAR INVASION IS IDENTIFIED. - INVASIVE CARCINOMA IS FOCALLY 0.1 CM TO THE ANTERIOR MARGIN OF SPECIMEN #1. - SEE ONCOLOGY TABLE BELOW. 2. Breast, excision, Additional inferior margin, left - FIBROCYSTIC CHANGES WITH USUAL DUCTAL HYPERPLASIA. - THERE IS NO EVIDENCE OF MALIGNANCY. - SEE COMMENT. 3. Breast, excision, Additional posterior margin, left - FIBROCYSTIC CHANGES WITH USUAL DUCTAL HYPERPLASIA. - THERE IS NO EVIDENCE OF MALIGNANCY. - SEE COMMENT. 4. Lymph node, sentinel, biopsy, Left axillary - THERE IS NO EVIDENCE OF CARCINOMA IN 1 OF 1 LYMPH NODE (0/1). 5. Fatty tissue, Left axillary tissue - BENIGN FIBROADIPOSE TISSUE. - LYMPH NODAL TISSUE IS NOT IDENTIFIED. - THERE IS NO EVIDENCE OF MALIGNANCY. Marland Kitchen 06/21/15 Diagnosis Breast, left, needle core biopsy - INVASIVE DUCTAL CARCINOMA. - DUCTAL CARCINOMA IN SITU. - SEE COMMENT.  Receptor Status: ER(100%), PR (100%), Her2-neu (negative)  Did patient present with symptoms (if so, please note symptoms) or was this found on screening mammography?: At the time of a CT scan of her abdomen for evaluation of upper abdominal discomfort on 08/26/2014 she was felt to have a 2.2 cm nodule in the 12:00 position within the left breast.  Past/Anticipated interventions by surgeon, if any: 07/27/15 - Procedure: RADIOACTIVE SEED GUIDED LEFT LUMPECTOMY  WITH AXILLARY SENTINEL LYMPH NODE BIOPSY;  Surgeon: Excell Seltzer, MD;  Location: [place];  Service: General;  Laterality: Left;  Past/Anticipated interventions by medical oncology, if any: none  Lymphedema issues, if any:  no  Pain issues, if any:  yes - has pain in her right upper arm that  started yesterday.  She has been taking aleve.  SAFETY ISSUES:  Prior radiation? no  Pacemaker/ICD? no  Possible current pregnancy?no  Is the patient on methotrexate? no  Current Complaints / other details:  Patient is here with her husband.  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  BP 165/95 mmHg  Pulse 83  Temp(Src) 98.2 F (36.8 C) (Oral)  Resp 16  Ht _0  (1.727 m)  Wt 170 lb 6.4 oz (77.293 kg)  BMI 25.92 kg/m2

## 2015-08-17 ENCOUNTER — Ambulatory Visit
Admission: RE | Admit: 2015-08-17 | Discharge: 2015-08-17 | Disposition: A | Discharge status: Home / Self Care | Payer: 59 | Source: Ambulatory Visit | Attending: Radiation Oncology | Admitting: Radiation Oncology

## 2015-08-17 ENCOUNTER — Ambulatory Visit (HOSPITAL_BASED_OUTPATIENT_CLINIC_OR_DEPARTMENT_OTHER): Payer: 59 | Admitting: Hematology

## 2015-08-17 ENCOUNTER — Encounter: Payer: Self-pay | Admitting: Hematology

## 2015-08-17 ENCOUNTER — Encounter: Payer: Self-pay | Admitting: Radiation Oncology

## 2015-08-17 VITALS — BP 165/95 | HR 83 | Temp 98.2°F | Resp 16 | Ht 68.0 in | Wt 170.4 lb

## 2015-08-17 VITALS — BP 149/95 | HR 88 | Temp 98.4°F | Resp 18 | Ht 68.0 in | Wt 170.3 lb

## 2015-08-17 DIAGNOSIS — I10 Essential (primary) hypertension: Secondary | ICD-10-CM | POA: Insufficient documentation

## 2015-08-17 DIAGNOSIS — F1010 Alcohol abuse, uncomplicated: Secondary | ICD-10-CM | POA: Diagnosis not present

## 2015-08-17 DIAGNOSIS — Z803 Family history of malignant neoplasm of breast: Secondary | ICD-10-CM | POA: Insufficient documentation

## 2015-08-17 DIAGNOSIS — Z170 Estrogen receptor positive status [ER+]: Secondary | ICD-10-CM

## 2015-08-17 DIAGNOSIS — C50412 Malignant neoplasm of upper-outer quadrant of left female breast: Secondary | ICD-10-CM | POA: Diagnosis not present

## 2015-08-17 DIAGNOSIS — Z510 Encounter for antineoplastic radiation therapy: Secondary | ICD-10-CM | POA: Insufficient documentation

## 2015-08-17 DIAGNOSIS — N951 Menopausal and female climacteric states: Secondary | ICD-10-CM

## 2015-08-17 DIAGNOSIS — E785 Hyperlipidemia, unspecified: Secondary | ICD-10-CM | POA: Insufficient documentation

## 2015-08-17 DIAGNOSIS — Z7982 Long term (current) use of aspirin: Secondary | ICD-10-CM | POA: Diagnosis not present

## 2015-08-17 DIAGNOSIS — C50512 Malignant neoplasm of lower-outer quadrant of left female breast: Secondary | ICD-10-CM | POA: Diagnosis present

## 2015-08-17 MED ORDER — VENLAFAXINE HCL ER 37.5 MG PO CP24
37.5000 mg | ORAL_CAPSULE | Freq: Every day | ORAL | Status: DC
Start: 2015-08-17 — End: 2015-10-20

## 2015-08-17 NOTE — Progress Notes (Signed)
[place]  [phone] [phone]  Clinic follow Up Note   Patient Care Team: Leighton Ruff, MD as PCP - General (Family Medicine) Excell Seltzer, MD as Consulting Physician (General Surgery) Truitt Merle, MD as Consulting Physician (Hematology) Arloa Koh, MD as Consulting Physician (Radiation Oncology) Sylvan Cheese, NP as Nurse Practitioner (Hematology and Oncology) 08/17/2015  CHIEF COMPLAINTS:  Follow up left breast cancer   Oncology History   Breast cancer of upper-outer quadrant of left female breast [facility])   Staging form: Breast, AJCC 7th Edition     Clinical: Stage IIA (T2, N0, cM0(i+)) - Unsigned       Breast cancer of upper-outer quadrant of left female breast ([place])   06/21/2015 Initial Diagnosis Breast cancer of upper-outer quadrant of left female breast ([place])   06/21/2015 Initial Biopsy Left breast core needle biopsy showed invasive ductal carcinoma, grade 2, and DCIS   06/21/2015 Receptors her2 ER 100% positive, PR 100% positive, HER-2 negative, Ki-67 2%   06/21/2015 Mammogram calcification on Mammogram, and a 3cm mass at 3:00 position of left breast on [place], axilla was negative on [place]    07/27/2015 Surgery left lumpectomy and SLN biopsy   07/27/2015 Pathology Results left breast lumpectomy showed G1 IDC, 1.8cm,  (+) DCIS, margins negative.    07/27/2015 Oncotype testing RS 4, which predicts 5% 10-y distance recurence risk with Tamoxifen     HISTORY OF PRESENTING ILLNESS:  Maria Larson 65 y.o. female is here because of her recently diagnosed left breast cancer. She is accompanied by her husband to our multidisciplinary breast clinic today.  The cancer was discovered by screening mammogram. She had ultrasound of breast for follow-up a left breast cyst 6 months ago which was negative except a 2.4 cm cyst. Her mammogram on 06/13/2015 showed a a new area of calcification and ultrasound showed 2.2 cm irregular solid mass in  the left breast at 3 clock position, which is new and suspicious for malignancy. She underwent ultrasound-guided core needle biopsy on 06/21/2015 which showed grade 2 invasive ductal carcinoma. ER/PR 100% positive, HER-2 negative.  She had no palpable breast mass before the biopsy. She tolerated the biopsy well, no complications. She feels well overall, denies any significant pain, dyspnea, GI or other symptoms. She is a current Pharmacist, hospital, still works full-time, she is active, bike 3-4 time a week for 20 months.   She had and it etopical pregnancy, was not able to conceive after infertility workup and treatment. She lives with her husband, they have a adopted son who is 56.   CURRENT THERAPY: pending adjuvant radiation   INTERIM HISTORY: [name] returns for follow-up. She is accompanied by her husband to the clinic today. She underwent left breast lumpectomy and sentinel node lymph node biopsy on 07/27/2015. She tolerated the surgery very well, incision site pain has resolved, she denies any significant left arm swollen, the range of motion of left shoulder is normal. She complains of occasional numbness in the left upper arm and in the breast, and occasional shooting pain at the incision site. No other new complaints.  MEDICAL HISTORY:  Past Medical History  Diagnosis Date  . Hypertension   . Breast cancer of upper-outer quadrant of left female breast ([place]) 06/24/2015  . Breast cancer of upper-outer quadrant of left female breast ([place]) 06/24/2015  . Breast cancer ([place])   . Hyperlipemia   . Family history of breast cancer   . PONV (postoperative nausea and vomiting)   .  Interstitial cystitis   . History of hiatal hernia     SURGICAL HISTORY: Past Surgical History  Procedure Laterality Date  . Abdominal hysterectomy    . Laproscopy      Multiple laparoscopy for infertility issue  . Colonoscopy w/ polypectomy    . Esophagogastroduodenoscopy endoscopy    . Radioactive seed guided mastectomy  with axillary sentinel lymph node biopsy Left 07/27/2015    Procedure: RADIOACTIVE SEED GUIDED LEFT LUMPECTOMY  WITH AXILLARY SENTINEL LYMPH NODE BIOPSY;  Surgeon: Excell Seltzer, MD;  Location: [facility];  Service: General;  Laterality: Left;    GYN HISTORY  Menarchal: 16 LMP: hysterectomy at 50  Contraceptive: none  HRT: yes, 14 years, stopped recently  G1P0:   SOCIAL HISTORY: Social History   Social History  . Marital Status: Married    Spouse Name: N/A  . Number of Children: One adopted sone who is 64   . Years of Education: N/A   Occupational History  . Kindergarten teacher   Social History Main Topics  . Smoking status: Never Smoker   . Smokeless tobacco: Not on file  . Alcohol Use: Yes     Comment: social   . Drug Use: No  . Sexual Activity: Not on file   Other Topics Concern  . Not on file   Social History Narrative    FAMILY HISTORY: Family History  Problem Relation Age of Onset  . Lung cancer Father   . Breast cancer Paternal Aunt     dx over 42  . Breast cancer Paternal Aunt     dx over 35s  . Breast cancer Paternal Aunt     dx over 32  . Kidney disease Maternal Grandfather   . Congestive Heart Failure Paternal Grandmother   . Colon cancer Paternal Aunt     dx in her 27s    ALLERGIES:  is allergic to sulfa antibiotics.  MEDICATIONS:  Current Outpatient Prescriptions  Medication Sig Dispense Refill  . aspirin 81 MG tablet Take 81 mg by mouth daily.    . Calcium Carb-Cholecalciferol (CALCIUM 600+D) 600-800 MG-UNIT TABS Take 1 tablet by mouth daily.    . Calcium Glycerophosphate (PRELIEF PO) Take 1 tablet by mouth 3 (three) times daily before meals.    . Meth-Hyo-M Bl-Na Phos-Ph Sal (URIBEL PO) Take 1 tablet by mouth 3 (three) times daily as needed.    . Multiple Vitamin (MULTIVITAMIN) tablet Take 1 tablet by mouth daily.    . naproxen sodium (ANAPROX) 220 MG tablet Take 220 mg by mouth 2 (two) times daily with a meal.    . omeprazole (PRILOSEC)  20 MG capsule Take 20 mg by mouth daily.    . ramipril (ALTACE) 10 MG capsule Take 10 mg by mouth daily.    . rosuvastatin (CRESTOR) 10 MG tablet Take 10 mg by mouth daily.    Marland Kitchen venlafaxine XR (EFFEXOR-XR) 37.5 MG 24 hr capsule Take 1 capsule (37.5 mg total) by mouth daily with breakfast. 30 capsule 1   No current facility-administered medications for this visit.    REVIEW OF SYSTEMS:   Constitutional: Denies fevers, chills or abnormal night sweats Eyes: Denies blurriness of vision, double vision or watery eyes Ears, nose, mouth, throat, and face: Denies mucositis or sore throat Respiratory: Denies cough, dyspnea or wheezes Cardiovascular: Denies palpitation, chest discomfort or lower extremity swelling Gastrointestinal:  Denies nausea, heartburn or change in bowel habits Skin: Denies abnormal skin rashes Lymphatics: Denies new lymphadenopathy or easy bruising Neurological:Denies numbness,  tingling or new weaknesses Behavioral/Psych: Mood is stable, no new changes  All other systems were reviewed with the patient and are negative.  PHYSICAL EXAMINATION: ECOG PERFORMANCE STATUS: 0 - Asymptomatic  Filed Vitals:   08/17/15 1104 08/17/15 1124  BP: 164/101 149/95  Pulse: 85 88  Temp: 98.4 F (36.9 C)   Resp: 18    Filed Weights   08/17/15 1104  Weight: 170 lb 4.8 oz (77.248 kg)    GENERAL:alert, no distress and comfortable SKIN: skin color, texture, turgor are normal, no rashes or significant lesions EYES: normal, conjunctiva are pink and non-injected, sclera clear OROPHARYNX:no exudate, no erythema and lips, buccal mucosa, and tongue normal  NECK: supple, thyroid normal size, non-tender, without nodularity LYMPH:  no palpable lymphadenopathy in the cervical, axillary or inguinal LUNGS: clear to auscultation and percussion with normal breathing effort HEART: regular rate & rhythm and no murmurs and no lower extremity edema ABDOMEN:abdomen soft, non-tender and normal bowel  sounds Musculoskeletal:no cyanosis of digits and no clubbing  PSYCH: alert & oriented x 3 with fluent speech NEURO: no focal motor/sensory deficits Breasts: Breast inspection showed them to be symmetrical with no nipple discharge. The surgical incisions in the left axilla and around the left nipple are healing very well, dry and clean, no skin erythema. Palpation of the breasts and axilla revealed no obvious mass that I could appreciate.   LABORATORY DATA:  I have reviewed the data as listed Lab Results  Component Value Date   WBC 6.4 07/20/2015   HGB 14.8 07/20/2015   HCT 45.3 07/20/2015   MCV 90.1 07/20/2015   PLT 293 07/20/2015    Recent Labs  08/26/14 2000 06/29/15 0849 07/20/15 1217  NA 139 141 141  K 3.6 4.1 4.1  CL 103  --  108  CO2 _0 GLUCOSE 111* 114 108*  BUN 12 17.5 13  CREATININE 0.91 0.9 0.88  CALCIUM 8.8 9.5 9.2  GFRNONAA 67*  --  >60  GFRAA 77*  --  >60  PROT 6.8 6.9 6.6  ALBUMIN 3.6 3.7 4.0  AST _1 ALT _2 ALKPHOS 65 70 65  BILITOT 1.5* 1.69* 1.2   PATHOLOGY REPORT: Diagnosis 07/27/2015 1. Breast, lumpectomy, Left - INVASIVE DUCTAL CARCINOMA, GRADE 1/3, SPANNING 1.8 CM. - DUCTAL CARCINOMA IN SITU, LOW GRADE. - LYMPHOVASCULAR INVASION IS IDENTIFIED. - INVASIVE CARCINOMA IS FOCALLY 0.1 CM TO THE ANTERIOR MARGIN OF SPECIMEN #1. - SEE ONCOLOGY TABLE BELOW. 2. Breast, excision, Additional inferior margin, left - FIBROCYSTIC CHANGES WITH USUAL DUCTAL HYPERPLASIA. - THERE IS NO EVIDENCE OF MALIGNANCY. - SEE COMMENT. 3. Breast, excision, Additional posterior margin, left - FIBROCYSTIC CHANGES WITH USUAL DUCTAL HYPERPLASIA. - THERE IS NO EVIDENCE OF MALIGNANCY. - SEE COMMENT. 4. Lymph node, sentinel, biopsy, Left axillary - THERE IS NO EVIDENCE OF CARCINOMA IN 1 OF 1 LYMPH NODE (0/1). 5. Fatty tissue, Left axillary tissue - BENIGN FIBROADIPOSE TISSUE. - LYMPH NODAL TISSUE IS NOT IDENTIFIED. - THERE IS NO EVIDENCE OF  MALIGNANCY.  Microscopic Comment 1. BREAST, INVASIVE TUMOR, WITH LYMPH NODES PRESENT Specimen, including laterality and lymph node sampling (sentinel, non-sentinel): Left breast and left axillary lymph node. Procedure: Seed localized lumpectomy, additional margin resections, and lymph node resection x1. Histologic type: Ductal. Grade: 1. Tubule formation: 3. Nuclear pleomorphism: 1. Mitotic: 1. Tumor size (gross measurement): 1.8 cm. Margins: Invasive, distance to closest margin: Focally 0.1 cm to the anterior margin. In-situ, distance to closest margin: Greater than  0.2 cm to all margins. Lymphovascular invasion: Present. Ductal carcinoma in situ: Present. Grade: Low grade. Extensive intraductal component: Not identified. Lobular neoplasia: Not identified. Tumor focality: Unifocal. Treatment effect: N/A. Extent of tumor: Confined to breast parenchyma. Lymph nodes: Examined: 1 Sentinel 0 Non-sentinel 1 Total Lymph nodes with metastasis: 0. Breast prognostic profile: [id number]. Estrogen receptor: 100%, strong staining intensity. Progesterone receptor: 100%, strong staining intensity. Her 2 neu: No amplification was determined. Her 2 neu by FISH will be repeated on the current case and the results reported separately. Ki-67: 2%. Non-neoplastic breast: Fibrocystic changes with calcifications, usual ductal hyperplasia and healing biopsy site. TNM: pT1c, pN0. 2. , and 3. The surgical resection margin(s) of the specimen were inked and microscopically evaluated. (JBK:ds 07/28/15)  Oncotype RS 4, which predicts 5% 10-y distance recurrence risk with Tamoxifen   RADIOGRAPHIC STUDIES: I have personally reviewed the radiological images as listed and agreed with the findings in the report.   ASSESSMENT & PLAN:  65 year old Caucasian postmenopausal female, with past medical history of hypertension,dyslipidemia, presented with screening discovered left breast cancer.  1. Breast  cancer of upper outer quadrant of left breast, pT1cN0M0, stage Ia,  ER and PR 100% positive, HER-2 negative, Ki67 2%, (+) DCIS, low RS 4 --We discussed her surgical pathology findings in great details with patient and her husband. -I reviewed her Oncotype DX test results, which showed a low risk recurrence score (4), predicts 5% 10 year distance recurrence risk with tamoxifen alone. Given the low risk of recurrence, she would not benefit adjuvant chemotherapy. -Given her strongly positive ER and PR, I do recommend antiestrogen therapy. She is postmenopausal, I would recommend a aromatase inhibitor for total 5 years, starts after she completes radiation. The benefit and potential side effects were discussed with her in details, she is interested. -She will likely benefit from breast radiation which will decrease her risk of local recurrence. She has seen rad/onc with Dr. Sondra Come, and she will likely proceed with radiation soon. -We discussed breast cancer surveillance after she completes treatment, Including annual mammogram, breast exam every 6-12 months. -the above was discussed with patient and her husband in great details, she agrees with the plan. -she is interested in rehabilitation, I'll refer her to lymphedema clinic for physical therapy.  2. Genetic -she was seen by genetic counselor in our cancer center, and her genetic testing for inheritable breast and ovarian syndrome panel was negative.  3. Hypertension and dyslipidemia -She will continue follow-up with her permit care physician  Plan -No adjuvant chemotherapy -She will proceed with breast radiation soon -PT referral  -I'll see her back after she completes radiation, to finalize her aromatase inhibitor therapy.  All questions were answered. The patient knows to call the clinic with any problems, questions or concerns. I spent 25 minutes counseling the patient face to face. The total time spent in the appointment was 30 minutes and  more than 50% was on counseling.     Truitt Merle, MD 08/17/2015 7:28 PM

## 2015-08-17 NOTE — Progress Notes (Signed)
Please see the Nurse Progress Note in the MD Initial Consult Encounter for this patient. 

## 2015-08-17 NOTE — Progress Notes (Signed)
Radiation Oncology         [phone] ________________________________  Name: Maria Larson MRN: [id number]  Date: 08/17/2015  DOB: 09-Apr-1951  MQ:[id number],[id number] STEWART, MD  Excell Seltzer, MD     REFERRING PHYSICIAN: Excell Seltzer, MD  Diagnosis:  Stage I Invasive ductal carcinoma of the left breast, lower outer quadrant pT1c, pN0.  HISTORY OF PRESENT ILLNESS::Maria Larson is a 65 y.o. female who is seen for an initial consultation visit regarding the patient's diagnosis of breast cancer. At the time of a CT scan of her abdomen for evaluation of upper abdominal discomfort on 08/26/2014 she was felt to have a 2.2 cm nodule in the 12:00 position within the left breast. Follow-up mammography at [facility] on 09/07/2014 showed a 2.4 cm cyst within the left breast and follow-up mammography was recommended. Follow-up mammography on 06/13/2015 showed a 2.2 cm solid mass within left breast at 3:00 . Mammography showed an adjacent area of calcifications within left breast at 5:00, anterior depth. A biopsy on 06/21/2015 was diagnostic for invasive ductal carcinoma along with DCIS. The invasive disease was felt to be grade 2. The invasive disease was ER positive at 100%, PR positive at 100% with a Ki-67 of 2%. HER-2/neu was negative.Of note is that she does give a 13 year history of hormone replacement therapy which was just discontinued.  The patient underwent left breast lumpectomy and sentinel node biopsy on 07/26/14 with Dr.Hoxworth. The surgery was completed without complications. Pathology indicated grade 1 invasive ductal carcinoma with low grade DCIS, lymphovascular invasion was identified (1.8 cm). The mass was focally 0.1 cm to the anterior margin. Sentinel node was negative. The patient has met with Dr.Howxowrth for post-op follow up.   An Oncotype test was ordered by Dr.Feng. The results indicated low risk and will not need adjuvant chemotherapy. Considering the patient's strongly  positive ER/PR, Dr.Feng has recommended antiestrogen therapy. Genetic testing was also performed and was negative as stated by the patient.   On today's visit, the patient complains of intermittent sharp, tingling pain in the left breast area. Complains of minor chafing and bruising in the surgical area.   No issues with infection, no major swelling. Denies breathing problems.  Patient lives in [place]. 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 Therefore, she prefers afternoon appointments past 2:45 PM.   PREVIOUS RADIATION THERAPY: No   PAST MEDICAL HISTORY:  has a past medical history of Hypertension; Breast cancer of upper-outer quadrant of left female breast ([place]) (06/24/2015); Breast cancer of upper-outer quadrant of left female breast ([place]) (06/24/2015); Breast cancer ([place]); Hyperlipemia; Family history of breast cancer; PONV (postoperative nausea and vomiting); Interstitial cystitis; and History of hiatal hernia.     PAST SURGICAL HISTORY: Past Surgical History  Procedure Laterality Date  . Abdominal hysterectomy    . Laproscopy      Multiple laparoscopy for infertility issue  . Colonoscopy w/ polypectomy    . Esophagogastroduodenoscopy endoscopy    . Radioactive seed guided mastectomy with axillary sentinel lymph node biopsy Left 07/27/2015    Procedure: RADIOACTIVE SEED GUIDED LEFT LUMPECTOMY  WITH AXILLARY SENTINEL LYMPH NODE BIOPSY;  Surgeon: Excell Seltzer, MD;  Location: [facility];  Service: General;  Laterality: Left;    FAMILY HISTORY: family history includes Breast cancer in her paternal aunt, paternal aunt, and paternal aunt; Colon cancer in her paternal aunt; Congestive Heart Failure in her paternal grandmother; Kidney disease in her maternal grandfather; Lung cancer in her father.  SOCIAL HISTORY:  reports that she has never smoked. She has never used smokeless tobacco. She reports that she drinks alcohol. She reports that she does not use illicit  drugs.   ALLERGIES: Sulfa antibiotics   MEDICATIONS:  Current Outpatient Prescriptions  Medication Sig Dispense Refill  . aspirin 81 MG tablet Take 81 mg by mouth daily.    . Calcium Carb-Cholecalciferol (CALCIUM 600+D) 600-800 MG-UNIT TABS Take 1 tablet by mouth daily.    . Calcium Glycerophosphate (PRELIEF PO) Take 1 tablet by mouth 3 (three) times daily before meals.    . Meth-Hyo-M Bl-Na Phos-Ph Sal (URIBEL PO) Take 1 tablet by mouth 3 (three) times daily as needed.    . Multiple Vitamin (MULTIVITAMIN) tablet Take 1 tablet by mouth daily.    . naproxen sodium (ANAPROX) 220 MG tablet Take 220 mg by mouth 2 (two) times daily with a meal.    . omeprazole (PRILOSEC) 20 MG capsule Take 20 mg by mouth daily.    . ramipril (ALTACE) 10 MG capsule Take 10 mg by mouth daily.    . rosuvastatin (CRESTOR) 10 MG tablet Take 10 mg by mouth daily.     No current facility-administered medications for this encounter.     REVIEW OF SYSTEMS:  A 15 point review of systems is documented in the electronic medical record. This was obtained by the nursing staff. However, I reviewed this with the patient to discuss relevant findings and make appropriate changes.  Pertinent items are noted in HPI.   PHYSICAL EXAM:  height is '5\' 8"'  (1.727 m) and weight is 170 lb 6.4 oz (77.293 kg). Her oral temperature is 98.2 F (36.8 C). Her blood pressure is 165/95 and her pulse is 83. Her respiration is 16.    ECOG = 1  General: Well-developed, in no acute distress, accompanied by husband on evaluation today  Neck: Supple without any lymphadenopathy Cardiovascular: Regular rate and  rhythm Respiratory: Clear to auscultation bilaterally Breasts: Well-healing curvilineous scar surrounding the areolar extending from approximately the 1 o'clock to the 6 o'clock position. Some surgical glue remaining. She also has a small scar in the axillary region that is well-healing. No dominant mass felt. No nipple discharge or  bleeding. Extremities: No edema present   LABORATORY DATA:  Lab Results  Component Value Date   WBC 6.4 07/20/2015   HGB 14.8 07/20/2015   HCT 45.3 07/20/2015   MCV 90.1 07/20/2015   PLT 293 07/20/2015   Lab Results  Component Value Date   NA 141 07/20/2015   K 4.1 07/20/2015   CL 108 07/20/2015   CO2 26 07/20/2015   Lab Results  Component Value Date   ALT 28 07/20/2015   AST 24 07/20/2015   ALKPHOS 65 07/20/2015   BILITOT 1.2 07/20/2015      RADIOGRAPHY: Nm Sentinel Node Inj-no Rpt (breast)  07/27/2015  CLINICAL DATA: Cancer left breast Sulfur colloid was injected intradermally by the nuclear medicine technologist for breast cancer sentinel node localization.       IMPRESSION:  Oncology History   Breast cancer of upper-outer quadrant of left female breast ([place])   Staging form: Breast, AJCC 7th Edition     Clinical: Stage IIA (T2, N0, cM0(i+)) - Unsigned       Breast cancer of upper-outer quadrant of left female breast ([place])   06/21/2015 Initial Diagnosis Breast cancer of upper-outer quadrant of left female breast ([place])   06/21/2015 Initial Biopsy Left breast core needle biopsy showed invasive ductal  carcinoma, grade 2, and DCIS   06/21/2015 Receptors her2 ER 100% positive, PR 100% positive, HER-2 negative, Ki-67 2%   06/21/2015 Mammogram calcification on Mammogram, and a 3cm mass at 3:00 position of left breast on [place], axilla was negative on [place]    07/27/2015 Surgery left lumpectomy and SLN biopsy   07/27/2015 Pathology Results left breast lumpectomy showed G1 IDC, 1.8cm,  (+) DCIS, margins negative.    07/27/2015 Oncotype testing RS 4, which predicts 5% 10-y distance recurence risk with Tamoxifen    Impression: The patient has a recent diagnosis of invasive ductal carcinoma (pT1c, pN0) of the left breast. She has completed surgery and appears to be doing satisfactorily postoperatively.  I discussed with the patient the role of adjuvant radiation treatment in this  setting. We discussed the potential benefit of radiation treatment, especially with regards to local control of the patient's tumor. We also discussed the possible side effects and risks of such a treatment as well.   All of the patient's questions were answered. The patient wishes to proceed with radiation treatment at the appropriate time. The patient has signed a consent  PLAN: The patient will proceed with simulation, and this will be completed January 31st at 3pm. Treatments to begin approximately 5 weeks postop. Hypo-fractionated radiation therapy will be offered if technically possible.  A boost will be given to the surgical bed in light of the close margin.    -----------------------------------  Blair Promise, PhD, MD  This document serves as a record of services personally performed by Gery Pray, MD. It was created on his behalf by Derek Mound, a trained medical scribe. The creation of this record is based on the scribe's personal observations and the provider's statements to them. This document has been checked and approved by the attending provider.

## 2015-08-23 ENCOUNTER — Ambulatory Visit
Admission: RE | Admit: 2015-08-23 | Discharge: 2015-08-23 | Disposition: A | Discharge status: Home / Self Care | Payer: 59 | Source: Ambulatory Visit | Attending: Radiation Oncology | Admitting: Radiation Oncology

## 2015-08-23 DIAGNOSIS — Z510 Encounter for antineoplastic radiation therapy: Secondary | ICD-10-CM | POA: Diagnosis not present

## 2015-08-24 ENCOUNTER — Ambulatory Visit: Payer: 59 | Attending: Hematology | Admitting: Physical Therapy

## 2015-08-24 DIAGNOSIS — M7582 Other shoulder lesions, left shoulder: Secondary | ICD-10-CM | POA: Diagnosis present

## 2015-08-24 DIAGNOSIS — M25612 Stiffness of left shoulder, not elsewhere classified: Secondary | ICD-10-CM

## 2015-08-24 DIAGNOSIS — Z9189 Other specified personal risk factors, not elsewhere classified: Secondary | ICD-10-CM | POA: Diagnosis present

## 2015-08-24 DIAGNOSIS — R6889 Other general symptoms and signs: Secondary | ICD-10-CM | POA: Insufficient documentation

## 2015-08-24 NOTE — Therapy (Signed)
[place], [place], [address] Phone: [phone]   Fax:  [phone]  Physical Therapy Evaluation  Patient Details  Name: Maria Larson MRN: [id number] Date of Birth: 03-10-1951 Referring Provider: Dr. Truitt Merle  Encounter Date: 08/24/2015      PT End of Session - 08/24/15 2038    Visit Number 1   Number of Visits 1   PT Start Time 1520   PT Stop Time 1609   PT Time Calculation (min) 49 min   Activity Tolerance Patient tolerated treatment well   Behavior During Therapy [facility] for tasks assessed/performed      Past Medical History  Diagnosis Date  . Hypertension   . Breast cancer of upper-outer quadrant of left female breast ([place]) 06/24/2015  . Breast cancer of upper-outer quadrant of left female breast ([place]) 06/24/2015  . Breast cancer ([place])   . Hyperlipemia   . Family history of breast cancer   . PONV (postoperative nausea and vomiting)   . Interstitial cystitis   . History of hiatal hernia     Past Surgical History  Procedure Laterality Date  . Abdominal hysterectomy    . Laproscopy      Multiple laparoscopy for infertility issue  . Colonoscopy w/ polypectomy    . Esophagogastroduodenoscopy endoscopy    . Radioactive seed guided mastectomy with axillary sentinel lymph node biopsy Left 07/27/2015    Procedure: RADIOACTIVE SEED GUIDED LEFT LUMPECTOMY  WITH AXILLARY SENTINEL LYMPH NODE BIOPSY;  Surgeon: Excell Seltzer, MD;  Location: [facility];  Service: General;  Laterality: Left;    There were no vitals filed for this visit.  Visit Diagnosis:  Decreased ROM of left shoulder - Plan: PT plan of care cert/re-cert  At risk for lymphedema - Plan: PT plan of care cert/re-cert  Impaired function of upper extremity - Plan: PT plan of care cert/re-cert      Subjective Assessment - 08/24/15 1521    Subjective Surgery four weeks ago today for breast cancer; will start radiation Tuesday  next week.   Still having some numbness or tingling down that arm.  Oncologist sent me to see if you needed to do anything.  Arm tires when she dries her hair; haven't done any heavy lifting.   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  It is ER/PR positive and HER2 negative.  Had lumpectomy 07/27/15; will start radiation next week. Had one node removed, which was negative.   Will be on a hormone medication.  HTN controlled with meds.  Hyperlipidemia.     Patient Stated Goals see if we can help tingling or help be able to hold the arm up for radiation   Currently in Pain? No/denies  gets soreness intermittently, recently from tattoos for XRT, or other sharp pains and soreness            [facility] PT Assessment - 08/24/15 0001    Assessment   Medical Diagnosis Left breast cancer   Referring Provider Dr. Truitt Merle   Onset Date/Surgical Date 07/27/15   Hand Dominance Right   Precautions   Precautions Other (comment)   Precaution Comments cancer precautions   Restrictions   Weight Bearing Restrictions No   Balance Screen   Has the patient fallen in the past 6 months No   Has the patient had a decrease in activity level because of a fear  of falling?  No   Is the patient reluctant to leave their home because of a fear of falling?  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   Leisure She walks 3-4 x/week for 30 minutes and bikes twice a month (tries to walk 2 miles and if stationary bike, 40 minutes)   Cognition   Overall Cognitive Status Within Functional Limits for tasks assessed   Observation/Other Assessments   Skin Integrity skin looks good; both  breast and axillary incisions are healing well   Quick DASH  15.91   Posture/Postural Control   Posture/Postural Control Postural limitations   Postural Limitations Rounded Shoulders;Forward head   AROM   Right Shoulder Flexion 151 Degrees   Right Shoulder ABduction 145 Degrees   Left Shoulder Flexion 140 Degrees   Left Shoulder ABduction 117 Degrees   Left Shoulder External Rotation 90 Degrees  in supine   Palpation   Palpation comment mild cording palpable at left axilla with shoulder abducted; some tight soft tissue (muscle/tendon) on both sides with abduction   Ambulation/Gait   Ambulation/Gait Yes   Ambulation/Gait Assistance 7: Independent           LYMPHEDEMA/ONCOLOGY QUESTIONNAIRE - 08/24/15 1539    Type   Cancer Type Left breast cancer   Surgeries   Lumpectomy Date 07/27/15   Number Lymph Nodes Removed 1   Treatment   Past Chemotherapy Treatment No   Right Upper Extremity Lymphedema   10 cm Proximal to Olecranon Process 26.5 cm   Olecranon Process 24.3 cm   10 cm Proximal to Ulnar Styloid Process 22.6 cm   Just Proximal to Ulnar Styloid Process 15.5 cm   Across Hand at [employer] 19.2 cm   At [place] of 2nd Digit 5.7 cm   Left Upper Extremity Lymphedema   10 cm Proximal to Olecranon Process 27.6 cm   Olecranon Process 24.4 cm   10 cm Proximal to Ulnar Styloid Process 22.3 cm   Just Proximal to Ulnar Styloid Process 15.1 cm   Across Hand at [employer] 18.9 cm   At [place] of 2nd Digit 5.5 cm           Quick Dash - 08/24/15 0001    Open a tight or new jar Mild difficulty   Do heavy household chores (wash walls, wash floors) Mild difficulty   Carry a shopping bag or briefcase No difficulty   Wash your back Mild difficulty   Use a knife to cut food No difficulty   During the past week, to what extent has your arm, shoulder or hand problem interfered with your normal social activities with family, friends, neighbors, or groups? Not at all   During the  past week, to what extent has your arm, shoulder or hand problem limited your work or other regular daily activities Slightly   Arm, shoulder, or hand pain. Moderate   Tingling (pins and needles) in your arm, shoulder, or hand Moderate   Difficulty Sleeping No difficulty   DASH Score 15.91 %                     PT Education - 08/24/15 2038    Education provided Yes   Education Details about  ABC class   Person(s) Educated Patient   Methods Explanation   Comprehension Verbalized understanding              Breast Clinic Goals - 06/29/15 1305    Patient will be able to verbalize understanding of pertinent lymphedema risk reduction practices relevant to her diagnosis specifically related to skin care.   Time 1   Period Days   Status Achieved   Patient will be able to return demonstrate and/or verbalize understanding of the post-op home exercise program related to regaining shoulder range of motion.   Time 1   Period Days   Status Achieved   Patient will be able to verbalize understanding of the importance of attending the postoperative After Breast Cancer Class for further lymphedema risk reduction education and therapeutic exercise.   Time 1   Period Days   Status Achieved          Long Term Clinic Goals - 08/24/15 2047    CC Long Term Goal  #1   Title Patient will be knowledgeable about post-breast cancer exercise and lymphedema risk reduction.   Time 3   Period Weeks   Status New  should be achieved at [facility] class on 09/12/15            Plan - 08/24/15 2039    Clinical Impression Statement This is a very pleasant woman who is doing well one month post lumpectomy for left breast cancer. She will be starting XRT soon and has had simulation already.  Her shoulder AROM is mildly limited, most notably in left shoulder active abduction.  Her arm circumference measurements show slight changes compared to before surgery,  but more in the right arm than left, so  she does not appear to have lymphedema and is at a very low risk for this.       Pt will benefit from skilled therapeutic intervention in order to improve on the following deficits Decreased range of motion;Impaired UE functional use;Decreased knowledge of precautions   Rehab Potential Excellent   Clinical Impairments Affecting Rehab Potential none   PT Frequency One time visit   PT Treatment/Interventions Patient/family education;ADLs/Self Care Home Management   PT Next Visit Plan No individual therapy planned; she has signed up to attend ABC class on 09/12/15 and will benefit from info about exercise as well as lymphedema that she will receive there.   PT Home Exercise Plan continue stretching she learned at multi-disciplinary clinic   Recommended Other Services ABC class   Consulted and Agree with Plan of Care Patient         Problem List Patient Active Problem List   Diagnosis Date Noted  . HTN (hypertension) 08/17/2015  . Genetic testing 07/12/2015  . Family history of breast cancer   . Breast cancer of upper-outer quadrant of left female breast ([place]) 06/24/2015    Roanne Haye 08/24/2015, 8:54 PM  [place], [place], [address] Phone: [phone]   Fax:  [phone]  Name: JAYNI PRESCHER MRN: [id number] Date of Birth: January 03, 1951   Serafina Royals, PT 08/24/2015 8:54 PM

## 2015-08-25 ENCOUNTER — Other Ambulatory Visit: Payer: Self-pay | Admitting: *Deleted

## 2015-08-29 DIAGNOSIS — Z510 Encounter for antineoplastic radiation therapy: Secondary | ICD-10-CM | POA: Diagnosis not present

## 2015-08-30 ENCOUNTER — Ambulatory Visit: Payer: 59 | Admitting: Physical Therapy

## 2015-08-30 ENCOUNTER — Ambulatory Visit
Admission: RE | Admit: 2015-08-30 | Discharge: 2015-08-30 | Disposition: A | Discharge status: Home / Self Care | Payer: 59 | Source: Ambulatory Visit | Attending: Radiation Oncology | Admitting: Radiation Oncology

## 2015-08-30 ENCOUNTER — Telehealth: Payer: Self-pay | Admitting: Hematology

## 2015-08-30 DIAGNOSIS — C50412 Malignant neoplasm of upper-outer quadrant of left female breast: Secondary | ICD-10-CM

## 2015-08-30 DIAGNOSIS — Z510 Encounter for antineoplastic radiation therapy: Secondary | ICD-10-CM | POA: Diagnosis not present

## 2015-08-30 NOTE — Telephone Encounter (Signed)
Spoke with patient re f/u for 3/31 @ 3:15 pm. Confirmed with KM, naviagator f/u should be after xrt complete 3/24. Per patient she scheduled a bone density test at [facility] 09/02/15, however they need an order. Order printed and faxed to [facility].

## 2015-08-30 NOTE — Progress Notes (Signed)
  Radiation Oncology         [phone] ________________________________  Name: Maria Larson MRN: [id number]  Date: 08/30/2015  DOB: [birth year]  Simulation Verification Note    ICD-9-CM ICD-10-CM   1. Breast cancer of upper-outer quadrant of left female breast ([place]) 174.4 C50.412     Status: outpatient  NARRATIVE: The patient was brought to the treatment unit and placed in the planned treatment position. The clinical setup was verified. Then port films were obtained and uploaded to the radiation oncology medical record software.  The treatment beams were carefully compared against the planned radiation fields. The position location and shape of the radiation fields was reviewed. They targeted volume of tissue appears to be appropriately covered by the radiation beams. Organs at risk appear to be excluded as planned.  Based on my personal review, I approved the simulation verification. The patient's treatment will proceed as planned.  -----------------------------------  Blair Promise, PhD, MD

## 2015-08-31 ENCOUNTER — Ambulatory Visit
Admission: RE | Admit: 2015-08-31 | Discharge: 2015-08-31 | Disposition: A | Discharge status: Home / Self Care | Payer: 59 | Source: Ambulatory Visit | Attending: Radiation Oncology | Admitting: Radiation Oncology

## 2015-08-31 DIAGNOSIS — Z510 Encounter for antineoplastic radiation therapy: Secondary | ICD-10-CM | POA: Diagnosis not present

## 2015-09-01 ENCOUNTER — Ambulatory Visit
Admission: RE | Admit: 2015-09-01 | Discharge: 2015-09-01 | Disposition: A | Discharge status: Home / Self Care | Payer: 59 | Source: Ambulatory Visit | Attending: Radiation Oncology | Admitting: Radiation Oncology

## 2015-09-01 DIAGNOSIS — Z510 Encounter for antineoplastic radiation therapy: Secondary | ICD-10-CM | POA: Diagnosis not present

## 2015-09-02 ENCOUNTER — Ambulatory Visit
Admission: RE | Admit: 2015-09-02 | Discharge: 2015-09-02 | Disposition: A | Discharge status: Home / Self Care | Payer: 59 | Source: Ambulatory Visit | Attending: Radiation Oncology | Admitting: Radiation Oncology

## 2015-09-02 DIAGNOSIS — Z510 Encounter for antineoplastic radiation therapy: Secondary | ICD-10-CM | POA: Diagnosis not present

## 2015-09-05 ENCOUNTER — Ambulatory Visit
Admission: RE | Admit: 2015-09-05 | Discharge: 2015-09-05 | Disposition: A | Discharge status: Home / Self Care | Payer: 59 | Source: Ambulatory Visit | Attending: Radiation Oncology | Admitting: Radiation Oncology

## 2015-09-05 DIAGNOSIS — Z510 Encounter for antineoplastic radiation therapy: Secondary | ICD-10-CM | POA: Diagnosis not present

## 2015-09-06 ENCOUNTER — Encounter: Payer: Self-pay | Admitting: Radiation Oncology

## 2015-09-06 ENCOUNTER — Ambulatory Visit
Admission: RE | Admit: 2015-09-06 | Discharge: 2015-09-06 | Disposition: A | Discharge status: Home / Self Care | Payer: 59 | Source: Ambulatory Visit | Attending: Radiation Oncology | Admitting: Radiation Oncology

## 2015-09-06 VITALS — BP 156/89 | HR 82 | Temp 98.0°F | Ht 68.0 in | Wt 169.7 lb

## 2015-09-06 DIAGNOSIS — C50412 Malignant neoplasm of upper-outer quadrant of left female breast: Secondary | ICD-10-CM | POA: Insufficient documentation

## 2015-09-06 DIAGNOSIS — Z923 Personal history of irradiation: Secondary | ICD-10-CM | POA: Insufficient documentation

## 2015-09-06 DIAGNOSIS — Z510 Encounter for antineoplastic radiation therapy: Secondary | ICD-10-CM | POA: Diagnosis not present

## 2015-09-06 DIAGNOSIS — R5383 Other fatigue: Secondary | ICD-10-CM | POA: Insufficient documentation

## 2015-09-06 MED ORDER — RADIAPLEXRX EX GEL
Freq: Once | CUTANEOUS | Status: AC
  Administered 2015-09-06: 16:00:00 via TOPICAL

## 2015-09-06 MED ORDER — ALRA NON-METALLIC DEODORANT (RAD-ONC)
1.0000 | Freq: Once | TOPICAL | Status: AC
Start: 2015-09-06 — End: 2015-09-06
  Administered 2015-09-06: 1 via TOPICAL

## 2015-09-06 NOTE — Progress Notes (Signed)
Pt here for patient teaching.  Pt given Radiation and You booklet, skin care instructions, Alra deodorant and Radiaplex gel. Pt reports they have not watched the Radiation Therapy Education video.  Reviewed areas of pertinence such as fatigue, skin changes, breast tenderness and breast swelling . Pt able to give teach back of to pat skin and use unscented/gentle soap,apply Radiaplex bid, avoid applying anything to skin within 4 hours of treatment and avoid wearing an under wire bra. Pt demonstrated understanding and verbalizes understanding of information given and will contact nursing with any questions or concerns.     Http://rtanswers.org/treatmentinformation/whattoexpect/index

## 2015-09-06 NOTE — Progress Notes (Signed)
Maria Larson has completed 5 fractions to her left breast.  She reports having soreness in her left breast.  She reports having fatigue in the evenings.  The skin on her left breast is pink and the breast appears swollen.  BP 156/89 mmHg  Pulse 82  Temp(Src) 98 F (36.7 C) (Oral)  Ht 5\' 8"  (1.727 m)  Wt 169 lb 11.2 oz (76.975 kg)  BMI 25.81 kg/m2

## 2015-09-06 NOTE — Progress Notes (Signed)
  Radiation Oncology         [phone] ________________________________  Name: Maria Larson MRN: [id number]  Date: 09/06/2015  DOB: Oct 20, 1950  Weekly Radiation Therapy Management    ICD-9-CM ICD-10-CM   1. Breast cancer of upper-outer quadrant of left female breast ([facility]) 174.4 C50.412      Current Dose: 13.35 Gy     Planned Dose:  52.72 Gy  Narrative . . . . . . . . The patient presents for routine under treatment assessment.                                   Raylene Ludtke has completed 5 fractions to her left breast. She reports having soreness in her left breast. She reports having fatigue in the evenings.                                 Set-up films were reviewed.                                 The chart was checked. Physical Findings. . .  height is 5\' 8"  (1.727 m) and weight is 169 lb 11.2 oz (76.975 kg). Her oral temperature is 98 F (36.7 C). Her blood pressure is 156/89 and her pulse is 82. . The lungs are clear. The heart has a regular rhythm and rate. The left breast area shows minimal erythema. Some slight swelling, no signs of infection Impression . . . . . . . The patient is tolerating radiation. Plan . . . . . . . . . . . . Continue treatment as planned.  ________________________________   Blair Promise, PhD, MD

## 2015-09-07 ENCOUNTER — Telehealth: Payer: Self-pay | Admitting: *Deleted

## 2015-09-07 ENCOUNTER — Ambulatory Visit
Admission: RE | Admit: 2015-09-07 | Discharge: 2015-09-07 | Disposition: A | Discharge status: Home / Self Care | Payer: 59 | Source: Ambulatory Visit | Attending: Radiation Oncology | Admitting: Radiation Oncology

## 2015-09-07 DIAGNOSIS — Z510 Encounter for antineoplastic radiation therapy: Secondary | ICD-10-CM | POA: Diagnosis not present

## 2015-09-07 NOTE — Telephone Encounter (Signed)
Left vm for pt to return call regarding needs during xrt. Contact information provided. 

## 2015-09-08 ENCOUNTER — Ambulatory Visit
Admission: RE | Admit: 2015-09-08 | Discharge: 2015-09-08 | Disposition: A | Discharge status: Home / Self Care | Payer: 59 | Source: Ambulatory Visit | Attending: Radiation Oncology | Admitting: Radiation Oncology

## 2015-09-08 DIAGNOSIS — Z510 Encounter for antineoplastic radiation therapy: Secondary | ICD-10-CM | POA: Diagnosis not present

## 2015-09-09 ENCOUNTER — Ambulatory Visit
Admission: RE | Admit: 2015-09-09 | Discharge: 2015-09-09 | Disposition: A | Discharge status: Home / Self Care | Payer: 59 | Source: Ambulatory Visit | Attending: Radiation Oncology | Admitting: Radiation Oncology

## 2015-09-09 DIAGNOSIS — Z510 Encounter for antineoplastic radiation therapy: Secondary | ICD-10-CM | POA: Diagnosis not present

## 2015-09-10 IMAGING — CT CT ABD-PELV W/ CM
2 of 5 series · 15 of 46 positions shown, 17 images · IV contrast (Omni 300)
Comparison: CT of the abdomen and pelvis from 09/11/2012

CLINICAL DATA: Acute onset of upper abdominal pain and right-sided
abdominal pain for 3 days. Initial encounter.

EXAM:
CT ABDOMEN AND PELVIS WITH CONTRAST
TECHNIQUE: Multidetector CT imaging of the abdomen and pelvis was performed
using the standard protocol following bolus administration of
intravenous contrast.
CONTRAST:  80mL OMNIPAQUE IOHEXOL 300 MG/ML  SOLN

[Series 2: abd/ pelvis 5.0 i30f 1 · axial · 0.74mm/px · z∈[-395,+60]mm · 12 of 103 slices shown, 14 images]
[im 6/103  soft-tissue]
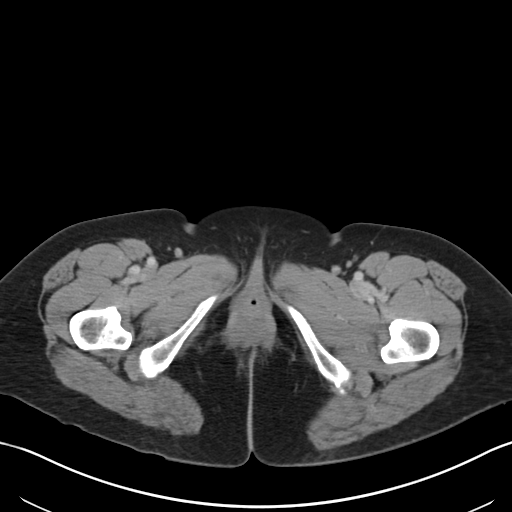
[im 6/103  bone]
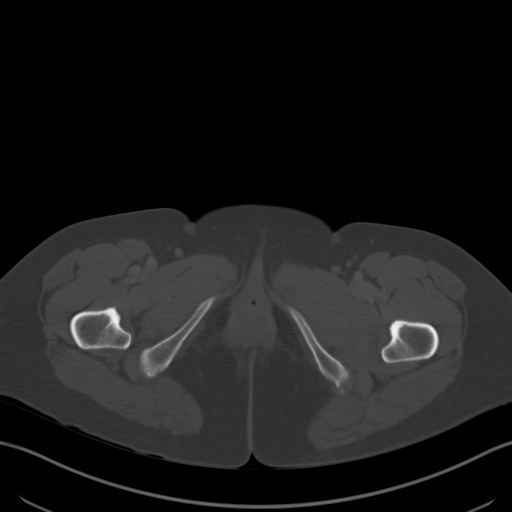
[im 17/103  soft-tissue]
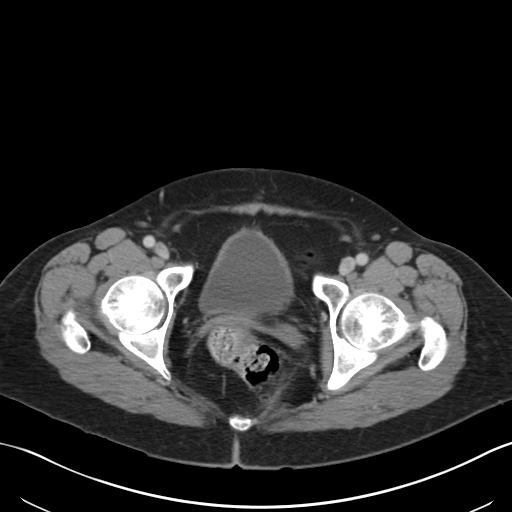
[im 22/103  soft-tissue]
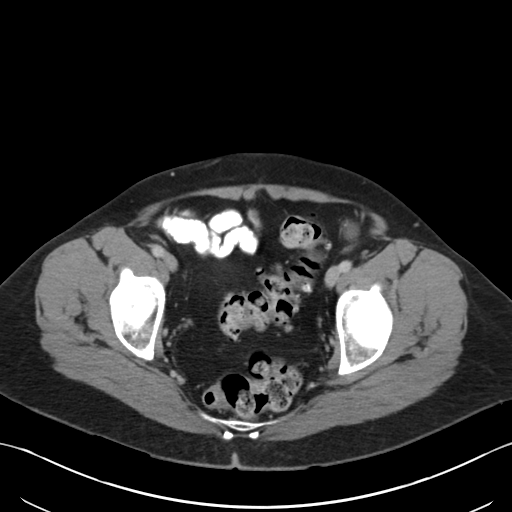
[im 33/103  soft-tissue]
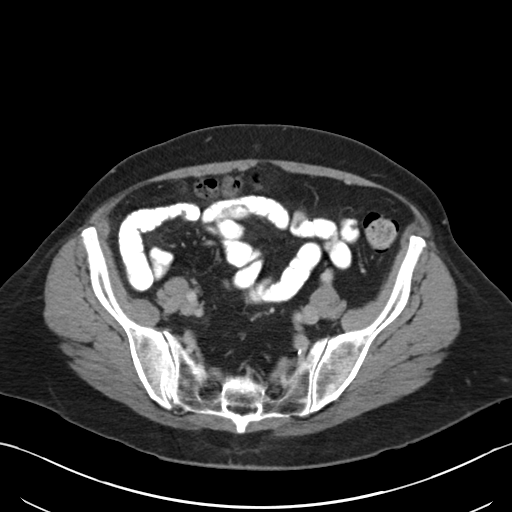
[im 38/103  soft-tissue]
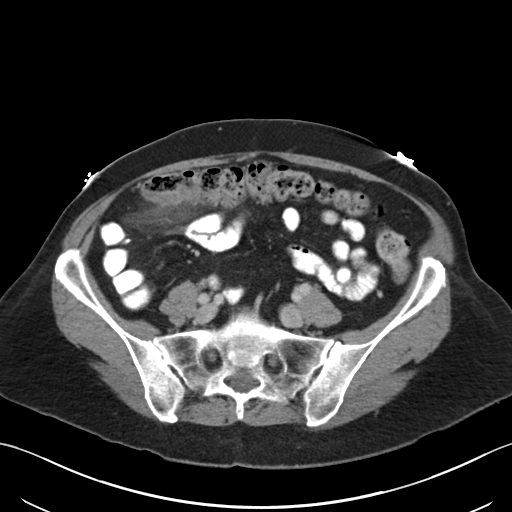
[im 49/103  soft-tissue]
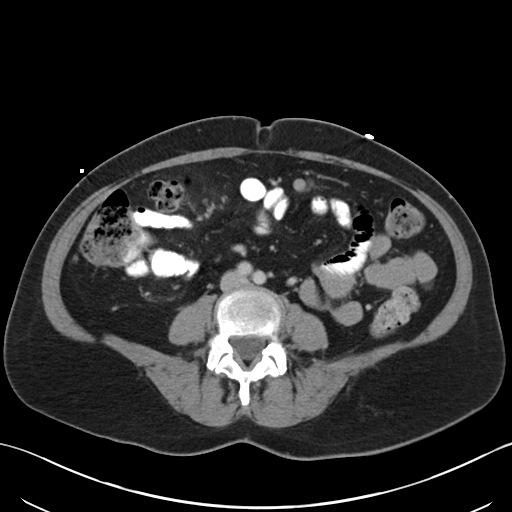
[im 54/103  soft-tissue]
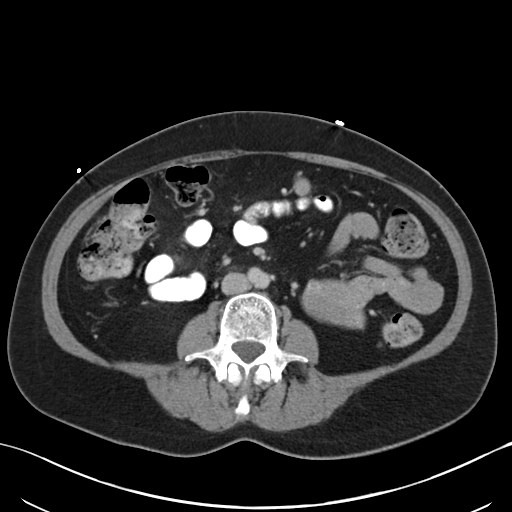
[im 65/103  soft-tissue]
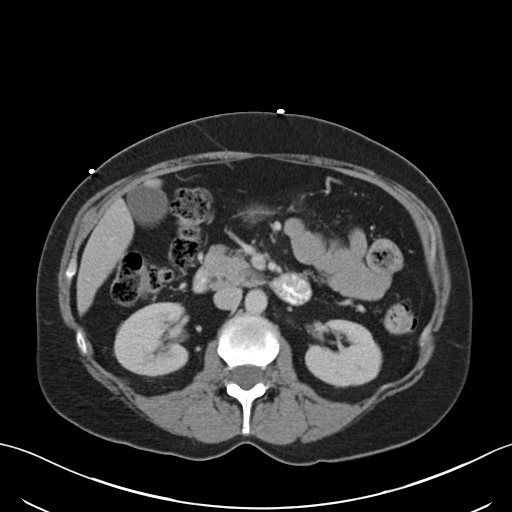
[im 70/103  soft-tissue]
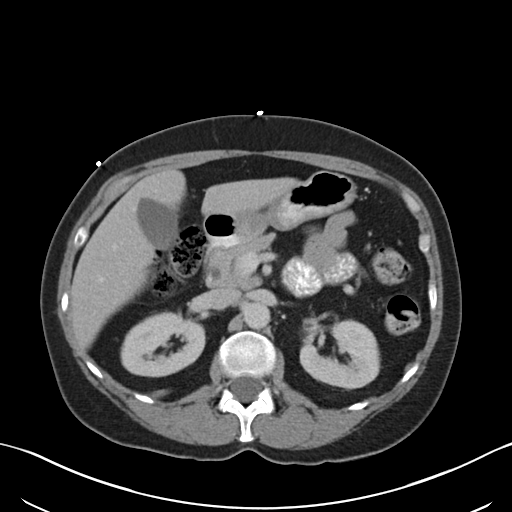
[im 70/103  bone]
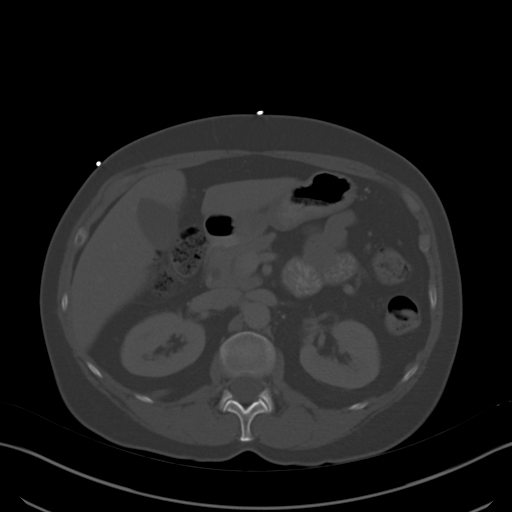
[im 81/103  soft-tissue]
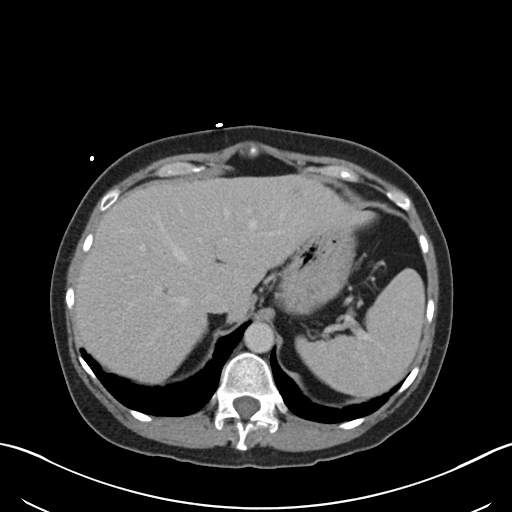
[im 86/103  soft-tissue]
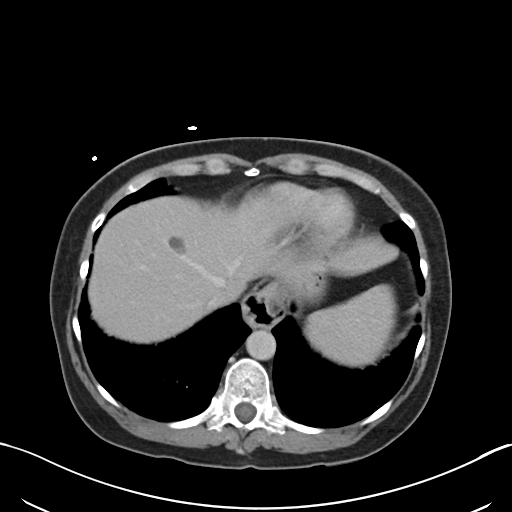
[im 97/103  soft-tissue]
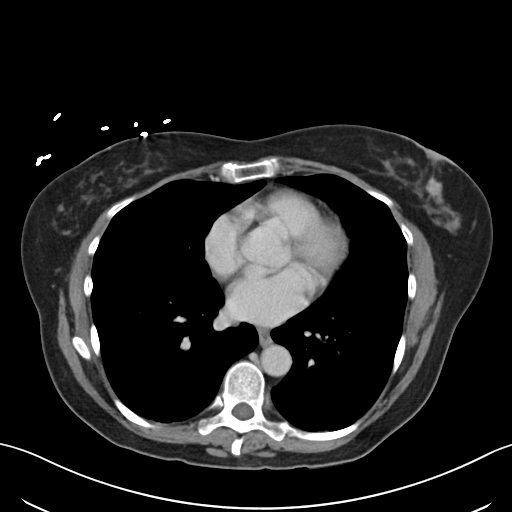

[Series 5: coronals · coronal · 0.74mm/px · 3 of 117 slices shown]
[im 39/117  soft-tissue]
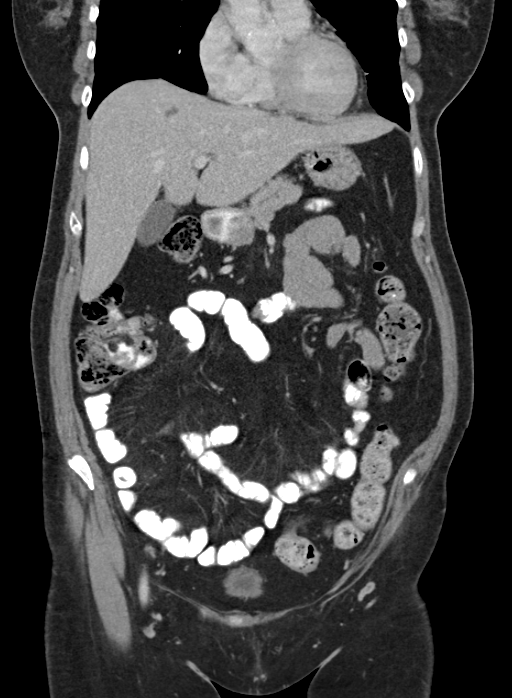
[im 52/117  soft-tissue]
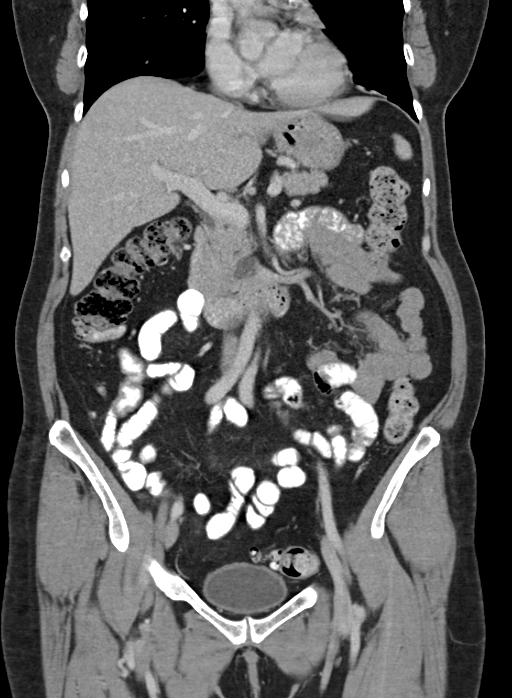
[im 65/117  soft-tissue]
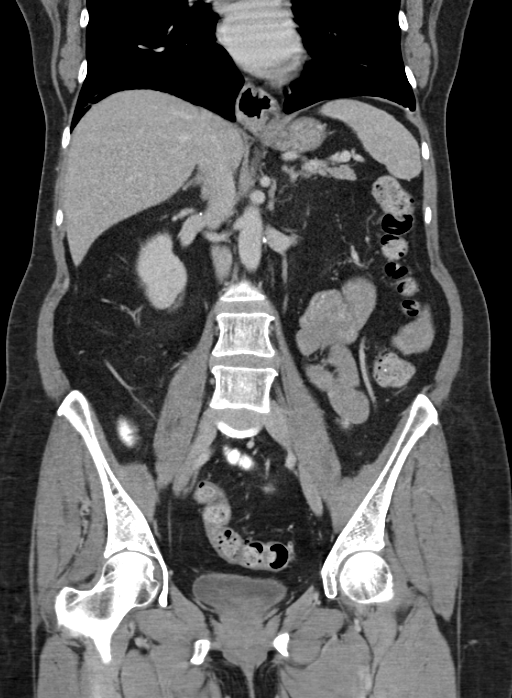

[15 of 46 positions shown; findings below may reference images not displayed]

FINDINGS: The visualized lung bases are clear. There appears to be a 2.2 cm
nodule at the left breast just above the level of the nipple. A tiny
hiatal hernia is noted.

Scattered hypodensities are seen in the liver. Some of these appear
to reflect cysts, though others are of only slightly decreased
attenuation. As these appear stable in size from 5564, these likely
reflect small hemangiomas. The spleen is unremarkable in appearance.
The gallbladder is within normal limits. The pancreas and adrenal
glands are unremarkable.

An apparent stable small 1.0 cm bilobed cyst is noted at the
anterior aspect of the left kidney. The kidneys are otherwise
unremarkable. Minimal nonspecific perinephric stranding is noted
bilaterally. There is no evidence of hydronephrosis. No renal or
ureteral stones are seen.

The small bowel is unremarkable in appearance. The stomach is within
normal limits. No acute vascular abnormalities are seen. Minimal
calcification is seen along the abdominal aorta and its branches.

The appendix is normal in caliber, without evidence for
appendicitis.

Focal soft tissue inflammation is noted along the proximal
transverse colon, with associated inflamed diverticulum and wall
thickening, compatible with acute diverticulitis. Scattered
diverticulosis involves the distal ascending, transverse, descending
and proximal sigmoid colon.

The bladder is mildly distended and grossly unremarkable. The
patient is status post hysterectomy. No suspicious adnexal masses
are seen. No inguinal lymphadenopathy is seen.

No acute osseous abnormalities are identified.
IMPRESSION: 1. Acute diverticulitis at the proximal transverse colon, with
associated wall thickening and soft tissue inflammation. No evidence
of perforation or abscess formation at this time.
2. 2.2 cm nodule at the left breast, about the 12 o'clock region.
Would correlate with recent mammogram, and consider follow-up
mammogram as deemed clinically appropriate.
3. Scattered diverticulosis along the distal ascending, transverse,
descending and proximal sigmoid colon.
4. Stable hypodensities within the liver. These appear to reflect
small hemangiomas and cysts. Stable small left renal bilobed cyst
also seen.
5. Tiny hiatal hernia seen.

## 2015-09-12 ENCOUNTER — Ambulatory Visit
Admission: RE | Admit: 2015-09-12 | Discharge: 2015-09-12 | Disposition: A | Discharge status: Home / Self Care | Payer: 59 | Source: Ambulatory Visit | Attending: Radiation Oncology | Admitting: Radiation Oncology

## 2015-09-12 DIAGNOSIS — Z510 Encounter for antineoplastic radiation therapy: Secondary | ICD-10-CM | POA: Diagnosis not present

## 2015-09-13 ENCOUNTER — Encounter: Payer: Self-pay | Admitting: Radiation Oncology

## 2015-09-13 ENCOUNTER — Ambulatory Visit
Admission: RE | Admit: 2015-09-13 | Discharge: 2015-09-13 | Disposition: A | Discharge status: Home / Self Care | Payer: 59 | Source: Ambulatory Visit | Attending: Radiation Oncology | Admitting: Radiation Oncology

## 2015-09-13 VITALS — BP 138/76 | HR 100 | Temp 98.0°F | Resp 16 | Ht 68.0 in | Wt 170.0 lb

## 2015-09-13 DIAGNOSIS — Z510 Encounter for antineoplastic radiation therapy: Secondary | ICD-10-CM | POA: Diagnosis not present

## 2015-09-13 DIAGNOSIS — C50412 Malignant neoplasm of upper-outer quadrant of left female breast: Secondary | ICD-10-CM

## 2015-09-13 NOTE — Progress Notes (Signed)
Maria Larson has completed 10 fractions to her left breat.  She reports having some discomfort with raising her arm today.  She was given new exercised from her ABC class that she is going to try.  She was also told to get a compression sleeve.  She repots she has a cold that started over the weekend.  The skin on her left breast is red.  Her breast is also swollen.  She is using radiaplex twice a day.  BP 138/76 mmHg  Pulse 100  Temp(Src) 98 F (36.7 C) (Oral)  Resp 16  Ht 5\' 8"  (1.727 m)  Wt 170 lb (77.111 kg)  BMI 25.85 kg/m2

## 2015-09-13 NOTE — Progress Notes (Signed)
  Radiation Oncology         [phone] ________________________________  Name: Maria Larson MRN: [id number]  Date: 09/13/2015  DOB: 1951-06-17  Weekly Radiation Therapy Management    ICD-9-CM ICD-10-CM   1. Breast cancer of upper-outer quadrant of left female breast ([facility]) 174.4 C50.412     Current Dose: 26.7 Gy     Planned Dose:  52.72 Gy  Narrative . . . . . . . . The patient presents for routine under treatment assessment. Maria Larson has completed 10 fractions to her left breat. She reports having some discomfort with raising her arm today. She was given new exercises from her ABC class that she is going to try. She was also recommended to get a compression sleeve by the ABC class. She reports she has a cold that started over the weekend. She denies itching or discomfort in the treatment area. She is using radiaplex twice a day.                                 Set-up films were reviewed.                                 The chart was checked. Physical Findings. . .  height is 5\' 8"  (1.727 m) and weight is 170 lb (77.111 kg). Her oral temperature is 98 F (36.7 C). Her blood pressure is 138/76 and her pulse is 100. Her respiration is 16.   Lungs are clear to auscultation bilaterally. Heart has regular rate and rhythm. The left breast shows some mild hyperpigmentation changes. No lymphedema was noted. Impression . . . . . . . The patient is tolerating radiation. Plan . . . . . . . . . . . . Continue treatment as planned. I will write a prescription for a lymphedema sleeve. ________________________________   Blair Promise, PhD, MD  This document serves as a record of services personally performed by Gery Pray, MD. It was created on his behalf by Darcus Austin, a trained medical scribe. The creation of this record is based on the scribe's personal observations and the provider's statements to them. This document has been checked and approved by the attending provider.

## 2015-09-14 ENCOUNTER — Ambulatory Visit
Admission: RE | Admit: 2015-09-14 | Discharge: 2015-09-14 | Disposition: A | Discharge status: Home / Self Care | Payer: 59 | Source: Ambulatory Visit | Attending: Radiation Oncology | Admitting: Radiation Oncology

## 2015-09-14 DIAGNOSIS — Z510 Encounter for antineoplastic radiation therapy: Secondary | ICD-10-CM | POA: Diagnosis not present

## 2015-09-15 ENCOUNTER — Telehealth: Payer: Self-pay | Admitting: *Deleted

## 2015-09-15 ENCOUNTER — Ambulatory Visit
Admission: RE | Admit: 2015-09-15 | Discharge: 2015-09-15 | Disposition: A | Discharge status: Home / Self Care | Payer: 59 | Source: Ambulatory Visit | Attending: Radiation Oncology | Admitting: Radiation Oncology

## 2015-09-15 DIAGNOSIS — Z510 Encounter for antineoplastic radiation therapy: Secondary | ICD-10-CM | POA: Diagnosis not present

## 2015-09-15 NOTE — Telephone Encounter (Signed)
Pt called back & informed that bone density shows osteopenia & to make sure to take cacium + D.  She reports that she is doing this.  Informed that Dr Burr Medico will discuss report in more detail at next visit. Pt expressed understanding & appreciation for call.

## 2015-09-15 NOTE — Telephone Encounter (Signed)
Left message for pt to call back tomorrow for report of bone density.

## 2015-09-16 ENCOUNTER — Ambulatory Visit
Admission: RE | Admit: 2015-09-16 | Discharge: 2015-09-16 | Disposition: A | Discharge status: Home / Self Care | Payer: 59 | Source: Ambulatory Visit | Attending: Radiation Oncology | Admitting: Radiation Oncology

## 2015-09-16 DIAGNOSIS — Z510 Encounter for antineoplastic radiation therapy: Secondary | ICD-10-CM | POA: Diagnosis not present

## 2015-09-19 ENCOUNTER — Ambulatory Visit
Admission: RE | Admit: 2015-09-19 | Discharge: 2015-09-19 | Disposition: A | Discharge status: Home / Self Care | Payer: 59 | Source: Ambulatory Visit | Attending: Radiation Oncology | Admitting: Radiation Oncology

## 2015-09-19 DIAGNOSIS — Z510 Encounter for antineoplastic radiation therapy: Secondary | ICD-10-CM | POA: Diagnosis not present

## 2015-09-20 ENCOUNTER — Encounter: Payer: Self-pay | Admitting: Radiation Oncology

## 2015-09-20 ENCOUNTER — Ambulatory Visit
Admission: RE | Admit: 2015-09-20 | Discharge: 2015-09-20 | Disposition: A | Discharge status: Home / Self Care | Payer: 59 | Source: Ambulatory Visit | Attending: Radiation Oncology | Admitting: Radiation Oncology

## 2015-09-20 ENCOUNTER — Ambulatory Visit: Admission: RE | Admit: 2015-09-20 | Payer: 59 | Source: Ambulatory Visit | Admitting: Radiation Oncology

## 2015-09-20 ENCOUNTER — Encounter (HOSPITAL_COMMUNITY): Payer: Self-pay

## 2015-09-20 VITALS — BP 154/96 | HR 91 | Temp 98.4°F | Ht 68.0 in | Wt 170.8 lb

## 2015-09-20 DIAGNOSIS — Z923 Personal history of irradiation: Secondary | ICD-10-CM | POA: Insufficient documentation

## 2015-09-20 DIAGNOSIS — C50412 Malignant neoplasm of upper-outer quadrant of left female breast: Secondary | ICD-10-CM

## 2015-09-20 DIAGNOSIS — Z510 Encounter for antineoplastic radiation therapy: Secondary | ICD-10-CM | POA: Diagnosis not present

## 2015-09-20 MED ORDER — SONAFINE EX EMUL
1.0000 | Freq: Once | CUTANEOUS | Status: AC
Start: 2015-09-20 — End: 2015-09-20
  Administered 2015-09-20: 1 via TOPICAL
  Filled 2015-09-20: qty 45

## 2015-09-20 NOTE — Progress Notes (Signed)
Maria Larson has completed 14 fractions to her left breast.  Maria Larson reports pain/burning in her left breast and is rating it a 5/10.  Maria Larson is also having occasional pains shooting across her left breast.  Maria Larson reports having fatigue in the afternoons.  The skin on her left breast is red with a rash like appearance on the borders.  Maria Larson reports having some itching.  Maria Larson is using radiaplex and has been given sonafine to try on the itching areas.  BP 154/96 mmHg  Pulse 91  Temp(Src) 98.4 F (36.9 C) (Oral)  Ht 5\' 8"  (1.727 m)  Wt 170 lb 12.8 oz (77.474 kg)  BMI 25.98 kg/m2

## 2015-09-20 NOTE — Progress Notes (Signed)
  Radiation Oncology         [phone] ________________________________  Name: SANDER STOGNER MRN: [id number]  Date: 09/20/2015  DOB: 1950/10/19  Weekly Radiation Therapy Management    ICD-9-CM ICD-10-CM   1. Breast cancer of upper-outer quadrant of left female breast ([facility]) 174.4 C50.412 SONAFINE emulsion 1 application     Current Dose: 37.38 Gy     Planned Dose:  52.72 Gy  Narrative . . . . . . . . The patient presents for routine under treatment assessment.                                   Jazlynne Winings has completed 14 fractions to her left breast. She reports pain/burning in her left breast and is rating it a 5/10. She is also having occasional pains shooting across her left breast. She reports having fatigue in the afternoons. The skin on her left breast is red with a rash like appearance on the borders. She reports having some itching. She is using radiaplex and has been given sonafine to try on the itching areas.                                 Set-up films were reviewed.                                 The chart was checked. Physical Findings. . .  height is 5\' 8"  (1.727 m) and weight is 170 lb 12.8 oz (77.474 kg). Her oral temperature is 98.4 F (36.9 C). Her blood pressure is 154/96 and her pulse is 91. . Weight essentially stable.   The skin on her left breast is red with a rash like appearance on the borders. Impression . . . . . . . The patient is tolerating radiation. Plan . . . . . . . . . . . . Continue treatment as planned.  ________________________________   Blair Promise, PhD, MD

## 2015-09-21 ENCOUNTER — Ambulatory Visit
Admission: RE | Admit: 2015-09-21 | Discharge: 2015-09-21 | Disposition: A | Discharge status: Home / Self Care | Payer: 59 | Source: Ambulatory Visit | Attending: Radiation Oncology | Admitting: Radiation Oncology

## 2015-09-21 DIAGNOSIS — C50412 Malignant neoplasm of upper-outer quadrant of left female breast: Secondary | ICD-10-CM

## 2015-09-21 DIAGNOSIS — Z510 Encounter for antineoplastic radiation therapy: Secondary | ICD-10-CM | POA: Diagnosis not present

## 2015-09-21 NOTE — Progress Notes (Signed)
  Radiation Oncology         [phone] ________________________________  Name: Maria Larson MRN: [id number]  Date: 09/21/2015  DOB: 1951-01-07  Simulation  Note    ICD-9-CM ICD-10-CM   1. Breast cancer of upper-outer quadrant of left female breast ([place]) 174.4 C50.412     Status: outpatient  NARRATIVE: The patient Underwent additional planning for recent therapy directed at the left breast. The patient's treatment planning CT scan was reviewed and she had set up of a boost field directed at the lumpectomy cavity within the left breast. Due to the depth within the breast area the patient will be treated with reduced field photon beams. 2 separate custom shaped beams will be used to deliver the patient's treatment. A computerized isodose plan will be generated for treatment. Plan is for the patient received 5 additional treatments at 2 gray per fraction for a boost dose of 10 gray.  -----------------------------------  Blair Promise, PhD, MD

## 2015-09-22 ENCOUNTER — Ambulatory Visit
Admission: RE | Admit: 2015-09-22 | Discharge: 2015-09-22 | Disposition: A | Discharge status: Home / Self Care | Payer: 59 | Source: Ambulatory Visit | Attending: Radiation Oncology | Admitting: Radiation Oncology

## 2015-09-22 ENCOUNTER — Ambulatory Visit: Payer: 59

## 2015-09-22 DIAGNOSIS — Z510 Encounter for antineoplastic radiation therapy: Secondary | ICD-10-CM | POA: Diagnosis not present

## 2015-09-23 ENCOUNTER — Ambulatory Visit
Admission: RE | Admit: 2015-09-23 | Discharge: 2015-09-23 | Disposition: A | Discharge status: Home / Self Care | Payer: 59 | Source: Ambulatory Visit | Attending: Radiation Oncology | Admitting: Radiation Oncology

## 2015-09-23 DIAGNOSIS — Z510 Encounter for antineoplastic radiation therapy: Secondary | ICD-10-CM | POA: Diagnosis not present

## 2015-09-26 ENCOUNTER — Ambulatory Visit
Admission: RE | Admit: 2015-09-26 | Discharge: 2015-09-26 | Disposition: A | Discharge status: Home / Self Care | Payer: 59 | Source: Ambulatory Visit | Attending: Radiation Oncology | Admitting: Radiation Oncology

## 2015-09-26 ENCOUNTER — Telehealth: Payer: Self-pay | Admitting: Hematology

## 2015-09-26 DIAGNOSIS — Z510 Encounter for antineoplastic radiation therapy: Secondary | ICD-10-CM | POA: Diagnosis not present

## 2015-09-26 NOTE — Telephone Encounter (Signed)
Returned call to patient re rescheduling 3/31 f/u. Gave patient new f/u for 3/30 @ 3:30 pm.

## 2015-09-27 ENCOUNTER — Ambulatory Visit
Admission: RE | Admit: 2015-09-27 | Discharge: 2015-09-27 | Disposition: A | Discharge status: Home / Self Care | Payer: 59 | Source: Ambulatory Visit | Attending: Radiation Oncology | Admitting: Radiation Oncology

## 2015-09-27 ENCOUNTER — Encounter: Payer: Self-pay | Admitting: Radiation Oncology

## 2015-09-27 VITALS — BP 171/93 | HR 76 | Temp 98.0°F | Ht 68.0 in | Wt 170.2 lb

## 2015-09-27 DIAGNOSIS — C50412 Malignant neoplasm of upper-outer quadrant of left female breast: Secondary | ICD-10-CM

## 2015-09-27 DIAGNOSIS — Z923 Personal history of irradiation: Secondary | ICD-10-CM | POA: Insufficient documentation

## 2015-09-27 DIAGNOSIS — Z510 Encounter for antineoplastic radiation therapy: Secondary | ICD-10-CM | POA: Diagnosis not present

## 2015-09-27 MED ORDER — RADIAPLEXRX EX GEL
Freq: Once | CUTANEOUS | Status: AC
  Administered 2015-09-27: 17:00:00 via TOPICAL

## 2015-09-27 NOTE — Progress Notes (Signed)
Bianco has completed 20 fractions to her left breast.  She reports tenderness and itching in left breast.  She denies having fatigue.  She is using radiaplex and sonfaine.  The skin on her left breast is red.  She has been given a refill of radiaplex and also hydrogel pads to try.  She has also been given a one month follow up card.  BP 171/93 mmHg  Pulse 76  Temp(Src) 98 F (36.7 C) (Oral)  Ht 5\' 8"  (1.727 m)  Wt 170 lb 3.2 oz (77.202 kg)  BMI 25.88 kg/m2

## 2015-09-27 NOTE — Progress Notes (Signed)
  Radiation Oncology         [phone] ________________________________  Name: Maria Larson MRN: [id number]  Date: 09/27/2015  DOB: Aug 17, 1950  Weekly Radiation Therapy Management    ICD-9-CM ICD-10-CM   1. Breast cancer of upper-outer quadrant of left female breast (HCC) 174.4 C50.412 hyaluronate sodium (RADIAPLEXRX) gel     Current Dose: 50.72 Gy     Planned Dose:  52.72 Gy  Narrative . . . . . . . . The patient presents for routine under treatment assessment.                                 Araia has completed 20 fractions to her left breast. She reports tenderness and itching in left breast. She denies having fatigue. She is using radiaplex and sonfaine.  She has been given a refill of radiaplex and also hydrogel pads to try. She has also been given a one month follow up card.                                    Set-up films were reviewed.                                 The chart was checked. Physical Findings. . .  height is 5\' 8"  (1.727 m) and weight is 170 lb 3.2 oz (77.202 kg). Her oral temperature is 98 F (36.7 C). Her blood pressure is 171/93 and her pulse is 76. . Weight essentially stable.  Diffuse erythema throughout the treatment area without any moist desquamation. Impression . . . . . . . The patient is tolerating radiation. Plan . . . . . . . . . . . . Continue treatment as planned.  She will complete her radiation therapy tomorrow.  ________________________________   Blair Promise, PhD, MD

## 2015-09-28 ENCOUNTER — Encounter: Payer: Self-pay | Admitting: Radiation Oncology

## 2015-09-28 ENCOUNTER — Ambulatory Visit
Admission: RE | Admit: 2015-09-28 | Discharge: 2015-09-28 | Disposition: A | Discharge status: Home / Self Care | Payer: 59 | Source: Ambulatory Visit | Attending: Radiation Oncology | Admitting: Radiation Oncology

## 2015-09-28 ENCOUNTER — Ambulatory Visit: Payer: 59

## 2015-09-28 DIAGNOSIS — Z510 Encounter for antineoplastic radiation therapy: Secondary | ICD-10-CM | POA: Diagnosis not present

## 2015-09-29 ENCOUNTER — Ambulatory Visit: Payer: 59

## 2015-09-30 ENCOUNTER — Ambulatory Visit: Payer: 59

## 2015-10-03 ENCOUNTER — Ambulatory Visit: Payer: 59

## 2015-10-03 ENCOUNTER — Telehealth: Payer: Self-pay | Admitting: *Deleted

## 2015-10-03 NOTE — Telephone Encounter (Signed)
Left message for a return phone call to follow up post XRT. 

## 2015-10-04 ENCOUNTER — Ambulatory Visit: Payer: 59

## 2015-10-04 ENCOUNTER — Ambulatory Visit: Payer: 59 | Admitting: Radiation Oncology

## 2015-10-05 ENCOUNTER — Other Ambulatory Visit: Payer: Self-pay | Admitting: Adult Health

## 2015-10-05 ENCOUNTER — Ambulatory Visit: Payer: 59

## 2015-10-05 DIAGNOSIS — C50412 Malignant neoplasm of upper-outer quadrant of left female breast: Secondary | ICD-10-CM

## 2015-10-05 NOTE — Progress Notes (Signed)
°  Radiation Oncology         [phone] ________________________________  Name: Maria Larson MRN: [id number]  Date: 09/28/2015  DOB: [birth year]  End of Treatment Note  Diagnosis:    ICD-9-CM ICD-10-CM   1.  Breast cancer of upper-outer quadrant of left female breast ([place]) 174.4 C50.412      Indication for treatment:  bReast conservation therapy       Radiation treatment dates:   08/31/2015-09/28/2015  Site/dose:   Left breast 42.72 cGy extending fractions, lumpectomy cavity boost 10 gray  Beams/energy:   3-D conformal with tangential beams, photon boost, 2 separate beams in light of the depth in the breast area  Narrative: The patient tolerated radiation treatment relatively well.   She reported tenderness and some itching within the left breast, no significant fatigue. No moist desquamation at the completion of treatment.  Plan: The patient has completed radiation treatment. The patient will return to radiation oncology clinic for routine followup in one month. I advised them to call or return sooner if they have any questions or concerns related to their recovery or treatment.  -----------------------------------  Blair Promise, PhD, MD

## 2015-10-06 ENCOUNTER — Ambulatory Visit: Payer: 59

## 2015-10-07 ENCOUNTER — Ambulatory Visit: Payer: 59

## 2015-10-10 ENCOUNTER — Ambulatory Visit: Payer: 59

## 2015-10-11 ENCOUNTER — Ambulatory Visit: Payer: 59

## 2015-10-12 ENCOUNTER — Ambulatory Visit: Payer: 59

## 2015-10-13 ENCOUNTER — Ambulatory Visit: Payer: 59

## 2015-10-14 ENCOUNTER — Ambulatory Visit: Payer: 59

## 2015-10-20 ENCOUNTER — Encounter: Payer: Self-pay | Admitting: Hematology

## 2015-10-20 ENCOUNTER — Telehealth: Payer: Self-pay | Admitting: Hematology

## 2015-10-20 ENCOUNTER — Ambulatory Visit (HOSPITAL_BASED_OUTPATIENT_CLINIC_OR_DEPARTMENT_OTHER): Payer: 59 | Admitting: Hematology

## 2015-10-20 VITALS — BP 166/98 | HR 81 | Temp 97.8°F | Resp 18 | Ht 68.0 in | Wt 171.2 lb

## 2015-10-20 DIAGNOSIS — C50812 Malignant neoplasm of overlapping sites of left female breast: Secondary | ICD-10-CM

## 2015-10-20 DIAGNOSIS — C50412 Malignant neoplasm of upper-outer quadrant of left female breast: Secondary | ICD-10-CM

## 2015-10-20 DIAGNOSIS — I10 Essential (primary) hypertension: Secondary | ICD-10-CM | POA: Diagnosis not present

## 2015-10-20 DIAGNOSIS — Z170 Estrogen receptor positive status [ER+]: Secondary | ICD-10-CM

## 2015-10-20 DIAGNOSIS — M8580 Other specified disorders of bone density and structure, unspecified site: Secondary | ICD-10-CM | POA: Diagnosis not present

## 2015-10-20 MED ORDER — LETROZOLE 2.5 MG PO TABS: 2.5000 mg | ORAL_TABLET | Freq: Every day | ORAL | Status: DC

## 2015-10-20 NOTE — Telephone Encounter (Signed)
appt made and avs printed °

## 2015-10-20 NOTE — Progress Notes (Addendum)
[facility]  [phone] Fax:(336) [phone]  Clinic follow Up Note   Patient Care Team: Leighton Ruff, MD as PCP - General (Family Medicine) Excell Seltzer, MD as Consulting Physician (General Surgery) Truitt Merle, MD as Consulting Physician (Hematology) Arloa Koh, MD as Consulting Physician (Radiation Oncology) Sylvan Cheese, NP as Nurse Practitioner (Hematology and Oncology) 10/20/2015  CHIEF COMPLAINTS:  Follow up left breast cancer   Oncology History   Breast cancer of upper-outer quadrant of left female breast [facility])   Staging form: Breast, AJCC 7th Edition     Clinical: Stage IIA (T2, N0, cM0(i+)) - Unsigned       Breast cancer of upper-outer quadrant of left female breast ([place])   06/21/2015 Initial Diagnosis Breast cancer of upper-outer quadrant of left female breast ([place])   06/21/2015 Initial Biopsy Left breast core needle biopsy showed invasive ductal carcinoma, grade 2, and DCIS   06/21/2015 Receptors her2 ER 100% positive, PR 100% positive, HER-2 negative, Ki-67 2%   06/21/2015 Mammogram calcification on Mammogram, and a 3cm mass at 3:00 position of left breast on [place], axilla was negative on [place]    07/27/2015 Surgery left lumpectomy and SLN biopsy   07/27/2015 Pathology Results left breast lumpectomy showed G1 IDC, 1.8cm,  (+) DCIS, margins negative.    07/27/2015 Oncotype testing RS 4, which predicts 5% 10-y distance recurence risk with Tamoxifen    08/31/2015 - 09/28/2015 Radiation Therapy breast adjuvant radiation     HISTORY OF PRESENTING ILLNESS:  Maria Larson 65 y.o. female is here because of her recently diagnosed left breast cancer. She is accompanied by her husband to our multidisciplinary breast clinic today.  The cancer was discovered by screening mammogram. She had ultrasound of breast for follow-up a left breast cyst 6 months ago which was negative except a 2.4 cm cyst. Her mammogram on 06/13/2015 showed a a new area of  calcification and ultrasound showed 2.2 cm irregular solid mass in the left breast at 3 clock position, which is new and suspicious for malignancy. She underwent ultrasound-guided core needle biopsy on 06/21/2015 which showed grade 2 invasive ductal carcinoma. ER/PR 100% positive, HER-2 negative.  She had no palpable breast mass before the biopsy. She tolerated the biopsy well, no complications. She feels well overall, denies any significant pain, dyspnea, GI or other symptoms. She is a current Pharmacist, hospital, still works full-time, she is active, bike 3-4 time a week for 20 months.   She had and it etopical pregnancy, was not able to conceive after infertility workup and treatment. She lives with her husband, they have a adopted son who is 89.   CURRENT THERAPY: pending letrozole 2.22m daily   INTERIM HISTORY: NMaeganreturns for follow-up. She is accompanied by her husband to the clinic today. She has completed his breast irradiation on 09/28/2015. She tolerated the treatment very well overall, did have mild to moderate radiation dermatitis. She is recovering well, still has mild tenderness and sensitivity in the left breast, she had moderate fatigue after she completed radiation, improved lately. She remains to be physically active, works full-time, exercise regularly, just biked [place] weekend. She had moderate to severe hot flashes since she stopped hormonal replacement in November 2016, but it has been improving gradually, it is tolerable now.  MEDICAL HISTORY:  Past Medical History  Diagnosis Date  . Hypertension   . Breast cancer of upper-outer quadrant of left female breast ([place] 06/24/2015  . Breast cancer of upper-outer quadrant of  left female breast ([place]) 06/24/2015  . Breast cancer ([place])   . Hyperlipemia   . Family history of breast cancer   . PONV (postoperative nausea and vomiting)   . Interstitial cystitis   . History of hiatal hernia     SURGICAL HISTORY: Past Surgical History    Procedure Laterality Date  . Abdominal hysterectomy    . Laproscopy      Multiple laparoscopy for infertility issue  . Colonoscopy w/ polypectomy    . Esophagogastroduodenoscopy endoscopy    . Radioactive seed guided mastectomy with axillary sentinel lymph node biopsy Left 07/27/2015    Procedure: RADIOACTIVE SEED GUIDED LEFT LUMPECTOMY  WITH AXILLARY SENTINEL LYMPH NODE BIOPSY;  Surgeon: Excell Seltzer, MD;  Location: [facility];  Service: General;  Laterality: Left;    GYN HISTORY  Menarchal: 16 LMP: hysterectomy at 50  Contraceptive: none  HRT: yes, 14 years, stopped in 05/2015 G1P0:   SOCIAL HISTORY: Social History   Social History  . Marital Status: Married    Spouse Name: N/A  . Number of Children: One adopted sone who is 49   . Years of Education: N/A   Occupational History  . Kindergarten teacher   Social History Main Topics  . Smoking status: Never Smoker   . Smokeless tobacco: Not on file  . Alcohol Use: Yes     Comment: social   . Drug Use: No  . Sexual Activity: Not on file   Other Topics Concern  . Not on file   Social History Narrative    FAMILY HISTORY: Family History  Problem Relation Age of Onset  . Lung cancer Father   . Breast cancer Paternal Aunt     dx over 35  . Breast cancer Paternal Aunt     dx over 7s  . Breast cancer Paternal Aunt     dx over 25  . Kidney disease Maternal Grandfather   . Congestive Heart Failure Paternal Grandmother   . Colon cancer Paternal Aunt     dx in her 61s    ALLERGIES:  is allergic to sulfa antibiotics.  MEDICATIONS:  Current Outpatient Prescriptions  Medication Sig Dispense Refill  . aspirin 81 MG tablet Take 81 mg by mouth daily.    . Calcium Carb-Cholecalciferol (CALCIUM 600+D) 600-800 MG-UNIT TABS Take 1 tablet by mouth daily.    . Calcium Glycerophosphate (PRELIEF PO) Take 1 tablet by mouth 3 (three) times daily before meals.    . hyaluronate sodium (RADIAPLEXRX) GEL Apply 1 application  topically 2 (two) times daily.    . Multiple Vitamin (MULTIVITAMIN) tablet Take 1 tablet by mouth daily.    . naproxen sodium (ANAPROX) 220 MG tablet Take 220 mg by mouth 2 (two) times daily with a meal. Reported on 10/29/7024    . non-metallic deodorant (ALRA) MISC Apply 1 application topically daily as needed.    Marland Kitchen omeprazole (PRILOSEC) 20 MG capsule Take 20 mg by mouth daily. Reported on 09/06/2015    . ramipril (ALTACE) 10 MG capsule Take 10 mg by mouth daily.    . ranitidine (ZANTAC) 150 MG tablet Take 150 mg by mouth 2 (two) times daily.    . rosuvastatin (CRESTOR) 10 MG tablet Take 10 mg by mouth daily.    . Meth-Hyo-M Bl-Na Phos-Ph Sal (URIBEL PO) Take 1 tablet by mouth as needed. Reported on 10/20/2015     No current facility-administered medications for this visit.    REVIEW OF SYSTEMS:   Constitutional: Denies fevers, chills  or abnormal night sweats Eyes: Denies blurriness of vision, double vision or watery eyes Ears, nose, mouth, throat, and face: Denies mucositis or sore throat Respiratory: Denies cough, dyspnea or wheezes Cardiovascular: Denies palpitation, chest discomfort or lower extremity swelling Gastrointestinal:  Denies nausea, heartburn or change in bowel habits Skin: Denies abnormal skin rashes Lymphatics: Denies new lymphadenopathy or easy bruising Neurological:Denies numbness, tingling or new weaknesses Behavioral/Psych: Mood is stable, no new changes  All other systems were reviewed with the patient and are negative.  PHYSICAL EXAMINATION: ECOG PERFORMANCE STATUS: 0 - Asymptomatic  Filed Vitals:   10/20/15 1538  BP: 166/98  Pulse: 81  Temp: 97.8 F (36.6 C)  Resp: 18   Filed Weights   10/20/15 1538  Weight: 171 lb 3.2 oz (77.656 kg)    GENERAL:alert, no distress and comfortable SKIN: skin color, texture, turgor are normal, no rashes or significant lesions EYES: normal, conjunctiva are pink and non-injected, sclera clear OROPHARYNX:no exudate, no  erythema and lips, buccal mucosa, and tongue normal  NECK: supple, thyroid normal size, non-tender, without nodularity LYMPH:  no palpable lymphadenopathy in the cervical, axillary or inguinal LUNGS: clear to auscultation and percussion with normal breathing effort HEART: regular rate & rhythm and no murmurs and no lower extremity edema ABDOMEN:abdomen soft, non-tender and normal bowel sounds Musculoskeletal:no cyanosis of digits and no clubbing  PSYCH: alert & oriented x 3 with fluent speech NEURO: no focal motor/sensory deficits Breasts: Breast inspection showed them to be symmetrical with no nipple discharge. The surgical incisions in the left axilla and around the left nipple has healed well, mild skin erythema and her mentation in the left breast. Palpation of the breasts and axilla revealed no obvious mass that I could appreciate.   LABORATORY DATA:  I have reviewed the data as listed CBC Latest Ref Rng 07/20/2015 06/29/2015 08/26/2014  WBC 4.0 - 10.5 K/uL 6.4 6.5 11.2(H)  Hemoglobin 12.0 - 15.0 g/dL 14.8 14.5 13.7  Hematocrit 36.0 - 46.0 % 45.3 43.4 41.4  Platelets 150 - 400 K/uL 293 287 249    CMP Latest Ref Rng 07/20/2015 06/29/2015 08/26/2014  Glucose 65 - 99 mg/dL 108(H) 114 111(H)  BUN 6 - 20 mg/dL 13 17.5 12  Creatinine 0.44 - 1.00 mg/dL 0.88 0.9 0.91  Sodium 135 - 145 mmol/L 141 141 139  Potassium 3.5 - 5.1 mmol/L 4.1 4.1 3.6  Chloride 101 - 111 mmol/L 108 - 103  CO2 22 - 32 mmol/L '26 24 29  ' Calcium 8.9 - 10.3 mg/dL 9.2 9.5 8.8  Total Protein 6.5 - 8.1 g/dL 6.6 6.9 6.8  Total Bilirubin 0.3 - 1.2 mg/dL 1.2 1.69(H) 1.5(H)  Alkaline Phos 38 - 126 U/L 65 70 65  AST 15 - 41 U/L '24 21 17  ' ALT 14 - 54 U/L '28 20 17     ' PATHOLOGY REPORT: Diagnosis 07/27/2015 1. Breast, lumpectomy, Left - INVASIVE DUCTAL CARCINOMA, GRADE 1/3, SPANNING 1.8 CM. - DUCTAL CARCINOMA IN SITU, LOW GRADE. - LYMPHOVASCULAR INVASION IS IDENTIFIED. - INVASIVE CARCINOMA IS FOCALLY 0.1 CM TO THE ANTERIOR  MARGIN OF SPECIMEN #1. - SEE ONCOLOGY TABLE BELOW. 2. Breast, excision, Additional inferior margin, left - FIBROCYSTIC CHANGES WITH USUAL DUCTAL HYPERPLASIA. - THERE IS NO EVIDENCE OF MALIGNANCY. - SEE COMMENT. 3. Breast, excision, Additional posterior margin, left - FIBROCYSTIC CHANGES WITH USUAL DUCTAL HYPERPLASIA. - THERE IS NO EVIDENCE OF MALIGNANCY. - SEE COMMENT. 4. Lymph node, sentinel, biopsy, Left axillary - THERE IS NO EVIDENCE OF CARCINOMA IN 1  OF 1 LYMPH NODE (0/1). 5. Fatty tissue, Left axillary tissue - BENIGN FIBROADIPOSE TISSUE. - LYMPH NODAL TISSUE IS NOT IDENTIFIED. - THERE IS NO EVIDENCE OF MALIGNANCY.  Microscopic Comment 1. BREAST, INVASIVE TUMOR, WITH LYMPH NODES PRESENT Specimen, including laterality and lymph node sampling (sentinel, non-sentinel): Left breast and left axillary lymph node. Procedure: Seed localized lumpectomy, additional margin resections, and lymph node resection x1. Histologic type: Ductal. Grade: 1. Tubule formation: 3. Nuclear pleomorphism: 1. Mitotic: 1. Tumor size (gross measurement): 1.8 cm. Margins: Invasive, distance to closest margin: Focally 0.1 cm to the anterior margin. In-situ, distance to closest margin: Greater than 0.2 cm to all margins. Lymphovascular invasion: Present. Ductal carcinoma in situ: Present. Grade: Low grade. Extensive intraductal component: Not identified. Lobular neoplasia: Not identified. Tumor focality: Unifocal. Treatment effect: N/A. Extent of tumor: Confined to breast parenchyma. Lymph nodes: Examined: 1 Sentinel 0 Non-sentinel 1 Total Lymph nodes with metastasis: 0. Breast prognostic profile: [id number]. Estrogen receptor: 100%, strong staining intensity. Progesterone receptor: 100%, strong staining intensity. Her 2 neu: No amplification was determined. Her 2 neu by FISH will be repeated on the current case and the results reported separately. Ki-67: 2%. Non-neoplastic breast:  Fibrocystic changes with calcifications, usual ductal hyperplasia and healing biopsy site. TNM: pT1c, pN0. 2. , and 3. The surgical resection margin(s) of the specimen were inked and microscopically evaluated. (JBK:ds 07/28/15)  Oncotype RS 4, which predicts 5% 10-y distance recurrence risk with Tamoxifen   RADIOGRAPHIC STUDIES: I have personally reviewed the radiological images as listed and agreed with the findings in the report.   ASSESSMENT & PLAN:  65 year old Caucasian postmenopausal female, with past medical history of hypertension,dyslipidemia, presented with screening discovered left breast cancer.  1. Breast cancer of upper outer quadrant of left breast, pT1cN0M0, stage Ia,  ER and PR 100% positive, HER-2 negative, Ki67 2%, (+) DCIS, low RS 4 --We discussed her surgical pathology findings in great details with patient and her husband. -I reviewed her Oncotype DX test results, which showed a low risk recurrence score (4), predicts 5% 10 year distance recurrence risk with tamoxifen alone. Given the low risk of recurrence, she would not benefit adjuvant chemotherapy. -Given her strongly positive ER and PR, I do recommend antiestrogen therapy. She is postmenopausal, I would recommend a aromatase inhibitor letrozole for total 5 years, the benefit and potential side effects, which includes but not limited to, hot flash, skin and vaginal dryness, metabolic or change, such as hyperlipidemia, hyperglycemia, weekend, slight increased risk of cardiovascular disease, muscular and joint discomfort, osteoporosis, etc. were discussed with her in details, she agrees to proceed -She has recovered well from radiation, she will start letrozole next week. I called into her pharmacy today  -We again reviewed breast cancer surveillance plan, Including annual mammogram, physical exam including breast exam every 6-12 months, and routine follow-up and lab test. -She is scheduled to see survivorship clinic in 1  months.  2. Hot flush -Started when she stopped hormonal replacement in November 2016. It has improved over time. -We discussed that her hot flash is likely going get worse with tamoxifen. I. She given her a prescription of Effexor, she has not started, but knows to use it if needed.  3. Osteopenia  -Her recent bone density scan in [place] showed osteopenia  (I'll get a copy of the report ) -We discussed that letrozole will probably worsen her bone density, and we'll monitor her bone density every 2 years  -I encouraged her to continue calcium and  vitamin D, she is very compliant  -I encouraged her to continue weightbearing exercise -I'll decide if she needs biphosphonate therapy based on her DEXA result   4. Genetic -she was seen by genetic counselor in our cancer center, and her genetic testing for inheritable breast and ovarian syndrome panel was negative.  5. Hypertension and dyslipidemia -She will continue follow-up with her permit care physician  Plan -Start letrozole next week, I called into her pharmacy today. -She is scheduled to see survivorship clinic in 1 month -I'll see her back in 3 months -Lab today and on next visit with me   All questions were answered. The patient knows to call the clinic with any problems, questions or concerns. I spent 25 minutes counseling the patient face to face. The total time spent in the appointment was 30 minutes and more than 50% was on counseling.     Truitt Merle, MD 10/20/2015 4:17 PM  Addendum:  Ardith Dark 09/12/2015  iMPRESSION:   Based on the WHO criteria this patient has osteopenia  Left femur neck T score -2.3, -13% change since February 2009   10 year probability of major  Osteoporotic fracture is 27% and hip fracture is 1.5%  She would benefit from Prolia, will discuss with her on her next visit .   Truitt Merle  10/21/2015

## 2015-10-21 ENCOUNTER — Ambulatory Visit: Payer: 59 | Admitting: Hematology

## 2015-10-21 ENCOUNTER — Other Ambulatory Visit: Payer: 59

## 2015-10-24 ENCOUNTER — Other Ambulatory Visit (HOSPITAL_BASED_OUTPATIENT_CLINIC_OR_DEPARTMENT_OTHER): Payer: 59

## 2015-10-24 DIAGNOSIS — C50812 Malignant neoplasm of overlapping sites of left female breast: Secondary | ICD-10-CM | POA: Diagnosis not present

## 2015-10-24 DIAGNOSIS — C50412 Malignant neoplasm of upper-outer quadrant of left female breast: Secondary | ICD-10-CM

## 2015-10-24 LAB — CBC WITH DIFFERENTIAL/PLATELET
BASO%: 0.6 % (ref 0.0–2.0)
Basophils Absolute: 0 10*3/uL (ref 0.0–0.1)
EOS ABS: 0.1 10*3/uL (ref 0.0–0.5)
EOS%: 1.1 % (ref 0.0–7.0)
HEMATOCRIT: 40.9 % (ref 34.8–46.6)
HEMOGLOBIN: 13.5 g/dL (ref 11.6–15.9)
LYMPH#: 1.2 10*3/uL (ref 0.9–3.3)
LYMPH%: 23.1 % (ref 14.0–49.7)
MCH: 29.2 pg (ref 25.1–34.0)
MCHC: 33 g/dL (ref 31.5–36.0)
MCV: 88.6 fL (ref 79.5–101.0)
MONO#: 0.5 10*3/uL (ref 0.1–0.9)
MONO%: 9.9 % (ref 0.0–14.0)
NEUT%: 65.3 % (ref 38.4–76.8)
NEUTROS ABS: 3.5 10*3/uL (ref 1.5–6.5)
Platelets: 256 10*3/uL (ref 145–400)
RBC: 4.62 10*6/uL (ref 3.70–5.45)
RDW: 13.1 % (ref 11.2–14.5)
WBC: 5.3 10*3/uL (ref 3.9–10.3)

## 2015-10-24 LAB — COMPREHENSIVE METABOLIC PANEL
ALBUMIN: 3.5 g/dL (ref 3.5–5.0)
ALK PHOS: 65 U/L (ref 40–150)
ALT: 23 U/L (ref 0–55)
AST: 22 U/L (ref 5–34)
Anion Gap: 5 mEq/L (ref 3–11)
BILIRUBIN TOTAL: 0.91 mg/dL (ref 0.20–1.20)
BUN: 16.6 mg/dL (ref 7.0–26.0)
CALCIUM: 9 mg/dL (ref 8.4–10.4)
CO2: 30 mEq/L — ABNORMAL HIGH (ref 22–29)
CREATININE: 0.8 mg/dL (ref 0.6–1.1)
Chloride: 108 mEq/L (ref 98–109)
EGFR: 79 mL/min/{1.73_m2} — ABNORMAL LOW (ref >90)
GLUCOSE: 96 mg/dL (ref 70–140)
Potassium: 3.9 mEq/L (ref 3.5–5.1)
SODIUM: 144 meq/L (ref 136–145)
TOTAL PROTEIN: 6.5 g/dL (ref 6.4–8.3)

## 2015-10-31 ENCOUNTER — Encounter: Payer: Self-pay | Admitting: Oncology

## 2015-11-03 ENCOUNTER — Ambulatory Visit
Admission: RE | Admit: 2015-11-03 | Discharge: 2015-11-03 | Disposition: A | Discharge status: Home / Self Care | Payer: 59 | Source: Ambulatory Visit | Attending: Radiation Oncology | Admitting: Radiation Oncology

## 2015-11-03 ENCOUNTER — Encounter: Payer: Self-pay | Admitting: Radiation Oncology

## 2015-11-03 VITALS — BP 148/91 | HR 91 | Temp 98.3°F | Resp 16 | Ht 66.0 in | Wt 169.3 lb

## 2015-11-03 DIAGNOSIS — C50412 Malignant neoplasm of upper-outer quadrant of left female breast: Secondary | ICD-10-CM

## 2015-11-03 NOTE — Progress Notes (Signed)
Radiation Oncology         [phone] ________________________________  Name: Maria Larson MRN: [id number]  Date: 11/03/2015  DOB: [birth year]  Follow-Up Visit Note  CC: Gerrit Heck, MD  Excell Seltzer, MD    ICD-9-CM ICD-10-CM   1. Breast cancer of upper-outer quadrant of left female breast ([place]) 174.4 C50.412     Diagnosis: Breast cancer of upper-outer quadrant of left female breast [facility])   Staging form: Breast, AJCC 7th Edition     Clinical stage from 06/21/2015: Stage IIA (T2, N0, cM0(i+)) - Signed by Truitt Merle, MD on 06/29/2015  Interval Since Last Radiation: 1 months   08/31/2015-09/28/2015: Left breast 42.72 cGy 16 fractions, lumpectomy cavity boost 10 gray  Narrative:  The patient returns today for routine follow-up. She reports occasional sharp pains in her left breast and some soreness. She reports occasional pruritus and uses the radiaplex as needed. She reports her energy level is good and has been biking. She is taking femara and is having hot flashes. They occur before she goes to sleep at night, in the morning, and afternoon.  ALLERGIES:  is allergic to sulfa antibiotics.  Meds: Current Outpatient Prescriptions  Medication Sig Dispense Refill  . aspirin 81 MG tablet Take 81 mg by mouth daily.    . Calcium Carb-Cholecalciferol (CALCIUM 600+D) 600-800 MG-UNIT TABS Take 1 tablet by mouth daily.    . Calcium Glycerophosphate (PRELIEF PO) Take 1 tablet by mouth 3 (three) times daily before meals.    . famotidine (PEPCID) 20 MG tablet Take 20 mg by mouth 2 (two) times daily.    . hyaluronate sodium (RADIAPLEXRX) GEL Apply 1 application topically 2 (two) times daily.    Marland Kitchen letrozole (FEMARA) 2.5 MG tablet Take 1 tablet (2.5 mg total) by mouth daily. 30 tablet 2  . Meth-Hyo-M Bl-Na Phos-Ph Sal (URIBEL PO) Take 1 tablet by mouth as needed. Reported on 10/20/2015    . Multiple Vitamin (MULTIVITAMIN) tablet Take 1 tablet by mouth daily.    . naproxen sodium  (ANAPROX) 220 MG tablet Take 220 mg by mouth 2 (two) times daily with a meal. Reported on 08/24/2015    . ramipril (ALTACE) 10 MG capsule Take 10 mg by mouth daily.    . rosuvastatin (CRESTOR) 10 MG tablet Take 10 mg by mouth daily.    . non-metallic deodorant Jethro Poling) MISC Apply 1 application topically daily as needed. Reported on 11/03/2015    . omeprazole (PRILOSEC) 20 MG capsule Take 20 mg by mouth daily. Reported on 11/03/2015    . ranitidine (ZANTAC) 150 MG tablet Take 150 mg by mouth 2 (two) times daily. Reported on 11/03/2015     No current facility-administered medications for this encounter.    Physical Findings: The patient is in no acute distress. Patient is alert and oriented.  height is 5\' 6"  (1.676 m) and weight is 169 lb 4.8 oz (76.794 kg). Her oral temperature is 98.3 F (36.8 C). Her blood pressure is 148/91 and her pulse is 91. Her respiration is 16.   Skin is well healed. Mild hyperpigmentation and mild edema. Induration just superior to the lumpectomy scar which is a little uncomfortable with palpation.  Lab Findings: Lab Results  Component Value Date   WBC 5.3 10/24/2015   HGB 13.5 10/24/2015   HCT 40.9 10/24/2015   MCV 88.6 10/24/2015   PLT 256 10/24/2015    Radiographic Findings: No results found.  Impression:  The patient is recovering from the effects of  radiation. No evidence of recurrence on clinical exam.  Plan: She will continue with Letrozole and follow up in 3 months. ____________________________________ -----------------------------------  Blair Promise, PhD, MD  This document serves as a record of services personally performed by Gery Pray, MD. It was created on his behalf by Darcus Austin, a trained medical scribe. The creation of this record is based on the scribe's personal observations and the provider's statements to them. This document has been checked and approved by the attending provider.

## 2015-11-03 NOTE — Progress Notes (Signed)
Maria Larson here for follow up.  She reports occasional sharp pains in her left breast and some soreness.  She reports her energy level is good and has been biking.  She is taking femara and is having hot flashes.  The skin on her left breast has hyperpigmentation.  She reports having occasional itching and uses the radiaplex as needed.  BP 148/91 mmHg  Pulse 91  Temp(Src) 98.3 F (36.8 C) (Oral)  Resp 16  Ht 5\' 6"  (1.676 m)  Wt 169 lb 4.8 oz (76.794 kg)  BMI 27.34 kg/m2   Wt Readings from Last 3 Encounters:  11/03/15 169 lb 4.8 oz (76.794 kg)  10/20/15 171 lb 3.2 oz (77.656 kg)  09/27/15 170 lb 3.2 oz (77.202 kg)

## 2015-11-15 ENCOUNTER — Encounter: Payer: Self-pay | Admitting: Hematology

## 2015-11-17 ENCOUNTER — Ambulatory Visit (HOSPITAL_BASED_OUTPATIENT_CLINIC_OR_DEPARTMENT_OTHER): Payer: 59 | Admitting: Nurse Practitioner

## 2015-11-17 ENCOUNTER — Telehealth: Payer: Self-pay | Admitting: Hematology

## 2015-11-17 ENCOUNTER — Encounter: Payer: Self-pay | Admitting: Nurse Practitioner

## 2015-11-17 VITALS — BP 165/97 | HR 87 | Temp 97.9°F | Resp 20 | Ht 66.0 in | Wt 169.6 lb

## 2015-11-17 DIAGNOSIS — Z170 Estrogen receptor positive status [ER+]: Secondary | ICD-10-CM

## 2015-11-17 DIAGNOSIS — Z79811 Long term (current) use of aromatase inhibitors: Secondary | ICD-10-CM | POA: Diagnosis not present

## 2015-11-17 DIAGNOSIS — C50412 Malignant neoplasm of upper-outer quadrant of left female breast: Secondary | ICD-10-CM | POA: Diagnosis not present

## 2015-11-17 NOTE — Telephone Encounter (Signed)
per pt req to r/s appt-gave pt copy of avs

## 2015-11-17 NOTE — Progress Notes (Signed)
CLINIC:  Cancer Survivorship   REASON FOR VISIT:  Routine follow-up post-treatment for a recent history of breast cancer.  BRIEF ONCOLOGIC HISTORY:  Oncology History   Breast cancer of upper-outer quadrant of left female breast ([place])   Staging form: Breast, AJCC 7th Edition     Clinical: Stage IIA (T2, N0, cM0(i+)) - Unsigned       Breast cancer of upper-outer quadrant of left female breast ([place])   06/21/2015 Mammogram calcification on Mammogram, and a 3cm mass at 3:00 position of left breast on [place], axilla was negative on [place]    06/21/2015 Initial Biopsy Left breast core needle biopsy showed invasive ductal carcinoma, grade 2, and DCIS   06/21/2015 Receptors her2 ER 100% positive, PR 100% positive, HER-2 negative, Ki-67 2%   06/21/2015 Clinical Stage Stage IIA (T2 N0)   07/04/2015 Procedure Breast/Ovarian panel: No clinically significant variants at ATM, BARD1, BRCA1, BRCA2, BRIP1, CDH1, CHEK2, EPCAM, FANCC, MLH1, MSH2, MSH6, NBN, PALB2, PMS2, PTEN, RAD51C, RAD51D, TP53, and XRCC2   07/27/2015 Surgery left lumpectomy and SLN biopsy   07/27/2015 Pathology Results left breast lumpectomy showed G1 IDC, 1.8cm,  (+) DCIS, margins negative. 0/1 LN. Repeat HER2/neu negative (ratio 1.54)   07/27/2015 Oncotype testing RS 4, which predicts 5% 10-y distance recurence risk with Tamoxifen    07/27/2015 Pathologic Stage Stage IA: pT1c pN0   08/31/2015 - 09/28/2015 Radiation Therapy breast adjuvant radiation: Left breast 42.72 cGy 16 fractions, lumpectomy cavity boost 10 gray   10/2015 -  Anti-estrogen oral therapy Letrozole 2.5 mg daily    INTERVAL HISTORY:  Maria Larson presents to the [facility] today for our initial meeting to review her survivorship care plan detailing her treatment course for breast cancer, as well as monitoring long-term side effects of that treatment, education regarding health maintenance, screening, and overall wellness and health promotion.     Overall, Ms. Hubbard reports  feeling quite well since completing her radiation therapy approximately one and a half months ago. She has had some increased breast tenderness over the last few weeks since beginning the letrozole three weeks ago.  She has increased hot flashes that awaken her at night.  She denies any mass or lesion within in her breast. Ms. Harnden reports that she developed some fatigue about two weeks after the radiation started, and questions whether it is related to the radiation or the letrozole.   She denies headache, cough, shortness of breath or bone pain.  She has a good appetite and denies weight loss. She is interested in using black cohosh for her hot flashes.   REVIEW OF SYSTEMS:  General:  Fatigue and hot flashes as above.   Denies fever, chills, or unintentional weight loss.Marland Kitchen  HEENT: Denies visual changes, hearing loss, mouth sores or difficulty swallowing. Cardiac: Denies palpitations, chest pain, and lower extremity edema.  Respiratory: Denies wheeze or dyspnea on exertion.  Breast: As above.  GI: Denies abdominal pain, constipation, diarrhea, nausea, or vomiting.  GU: Denies dysuria, hematuria, vaginal bleeding, vaginal discharge, or vaginal dryness.  Musculoskeletal: Denies joint or bone pain.  Neuro: Denies recent fall or numbness / tingling in her extremities. Skin: Denies rash, pruritis, or open wounds.  Psych: Denies depression, anxiety, insomnia, or memory loss.   A 14-point review of systems was completed and was negative, except as noted above.   ONCOLOGY TREATMENT TEAM:  1. Surgeon:  Dr. Excell Seltzer at [facility]  2. Medical Oncologist: Dr. Burr Medico 3. Radiation Oncologist: Dr. Sondra Come  PAST MEDICAL/SURGICAL HISTORY:  Past Medical History  Diagnosis Date  . Hypertension   . Breast cancer of upper-outer quadrant of left female breast ([place]) 06/24/2015  . Breast cancer of upper-outer quadrant of left female breast ([place]) 06/24/2015  . Breast cancer ([place])   . Hyperlipemia     . Family history of breast cancer   . PONV (postoperative nausea and vomiting)   . Interstitial cystitis   . History of hiatal hernia   . Radiation 08/31/2015-09/28/2015    Left breast 42.72 cGy extending fractions, lumpectomy cavity boost 10 gray   Past Surgical History  Procedure Laterality Date  . Abdominal hysterectomy    . Laproscopy      Multiple laparoscopy for infertility issue  . Colonoscopy w/ polypectomy    . Esophagogastroduodenoscopy endoscopy    . Radioactive seed guided mastectomy with axillary sentinel lymph node biopsy Left 07/27/2015    Procedure: RADIOACTIVE SEED GUIDED LEFT LUMPECTOMY  WITH AXILLARY SENTINEL LYMPH NODE BIOPSY;  Surgeon: Excell Seltzer, MD;  Location: [facility];  Service: General;  Laterality: Left;     ALLERGIES:  Allergies  Allergen Reactions  . Sulfa Antibiotics Other (See Comments)    Bladder problems     CURRENT MEDICATIONS:  Current Outpatient Prescriptions on File Prior to Visit  Medication Sig Dispense Refill  . aspirin 81 MG tablet Take 81 mg by mouth daily.    . Calcium Carb-Cholecalciferol (CALCIUM 600+D) 600-800 MG-UNIT TABS Take 1 tablet by mouth daily.    . Calcium Glycerophosphate (PRELIEF PO) Take 1 tablet by mouth 3 (three) times daily before meals.    . famotidine (PEPCID) 20 MG tablet Take 20 mg by mouth 2 (two) times daily.    . hyaluronate sodium (RADIAPLEXRX) GEL Apply 1 application topically 2 (two) times daily.    Marland Kitchen letrozole (FEMARA) 2.5 MG tablet Take 1 tablet (2.5 mg total) by mouth daily. 30 tablet 2  . Meth-Hyo-M Bl-Na Phos-Ph Sal (URIBEL PO) Take 1 tablet by mouth as needed. Reported on 10/20/2015    . Multiple Vitamin (MULTIVITAMIN) tablet Take 1 tablet by mouth daily.    . naproxen sodium (ANAPROX) 220 MG tablet Take 220 mg by mouth 2 (two) times daily with a meal. Reported on 02/26/6810    . non-metallic deodorant (ALRA) MISC Apply 1 application topically daily as needed. Reported on 11/03/2015    . omeprazole  (PRILOSEC) 20 MG capsule Take 20 mg by mouth daily. Reported on 11/03/2015    . ramipril (ALTACE) 10 MG capsule Take 10 mg by mouth daily.    . rosuvastatin (CRESTOR) 10 MG tablet Take 10 mg by mouth daily.    . ranitidine (ZANTAC) 150 MG tablet Take 150 mg by mouth 2 (two) times daily. Reported on 11/17/2015     No current facility-administered medications on file prior to visit.     ONCOLOGIC FAMILY HISTORY:  Family History  Problem Relation Age of Onset  . Lung cancer Father   . Breast cancer Paternal Aunt     dx over 13  . Breast cancer Paternal Aunt     dx over 14s  . Breast cancer Paternal Aunt     dx over 38  . Kidney disease Maternal Grandfather   . Congestive Heart Failure Paternal Grandmother   . Colon cancer Paternal Aunt     dx in her 62s     GENETIC COUNSELING/TESTING: Yes, performed 07/04/2015: Breast/Ovarian panel: No clinically significant variants at ATM, BARD1, BRCA1, BRCA2, BRIP1, CDH1, CHEK2,  EPCAM, FANCC, MLH1, MSH2, MSH6, NBN, PALB2, PMS2, PTEN, RAD51C, RAD51D, TP53, and XRCC2   SOCIAL HISTORY:  MEHGAN SANTMYER is married and lives with her family in [place], [place].  She has 1 child. 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  She denies any current or history of tobacco or illicit drug use.  She uses alcohol on a social basis.   PHYSICAL EXAMINATION:  Vital Signs: Filed Vitals:   11/17/15 1525  BP: 165/97  Pulse: 87  Temp: 97.9 F (36.6 C)  Resp: 20   ECOG Performance Status: 0  General: Well-nourished, well-appearing female in no acute distress.  She is unaccompanied in clinic today.   HEENT: Head is atraumatic and normocephalic.  Pupils equal and reactive to light and accomodation. Conjunctivae clear without exudate.  Sclerae anicteric. Oral mucosa is pink, moist, and intact without lesions.  Oropharynx is pink without lesions or erythema.  Lymph: No cervical, supraclavicular, infraclavicular, or axillary  lymphadenopathy noted on palpation.  Cardiovascular: Regular rate and rhythm without murmurs, rubs, or gallops. Respiratory: Clear to auscultation bilaterally. Chest expansion symmetric without accessory muscle use on inspiration or expiration.  Breast: Left lumpectomy scar intact and without nodularity or thickening.  No mass or lesion within her breast.  Skin darkening consistent with post radiation changes along left breast with minimal edema.  No erythema nor heat. GI: Abdomen soft and round. No tenderness to palpation. Bowel sounds normoactive in 4 quadrants. GU: Deferred.    Neuro: No focal deficits. Steady gait.  Psych: Mood and affect normal and appropriate for situation.  Extremities: No edema, cyanosis, or clubbing.  Skin: Warm and dry. No open lesions noted.   LABORATORY DATA:  None for this visit.  DIAGNOSTIC IMAGING:  None for this visit.     ASSESSMENT AND PLAN:   1. 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  Ms. Haroon is doing well without clinical symptoms worrisome for disease recurrence. I believe that her left breast pain is inflammatory and related to post treatment changes.  It may be possible that she is developing some beginning infection in the left breast and I have asked her to monitor this closely for increased swelling, redness or erythema.  If these develop, we will begin a course of antibiotics.  She states that she will let us know.  I have reinforced that the fatigue she is experiencing is most likely related to the radiation rather than the letrozole and should continue to improve.  I have advised her to continue her physical activity to assist with this.  She will follow-up with her medical oncologist,  Dr. Burr Medico, in May 2017 with history and physical  examination per surveillance protocol.  She will see Dr. Sondra Come in July 2017. She will continue her anti-estrogen therapy with letrozole as prescribed by Dr. Burr Medico at this time and notify us if she begins to experience any new or increased side effects of the medication.  A comprehensive survivorship care plan and treatment summary was reviewed with the patient today detailing her breast cancer diagnosis, treatment course, potential late/long-term effects of treatment, appropriate follow-up care with recommendations for the future, and patient education resources.  A copy of this summary, along with a letter will be sent to the patient's primary care provider via in basket message after  today's visit.  Ms. Bertini is welcome to return to the Survivorship Clinic in the future, as needed; no follow-up will be scheduled at this time.    2. Hot flashes: We have discussed the nature and etiology of Ms. Debroux's hot flashes with her today and I have shared with her that they may improve slightly as she has only been on the medication for three weeks.  We reviewed the data, which is limited, on the use of black cohosh.  In review of the Office of Complementary and Alternative Medicines database as well as the interaction checker on epocrates, there does not appear to be an interaction between it and her other medications.  We do not have literature supporting its use.  She would like to attempt it and if it is not effective, will discontinue it.  She is very reluctant to consider the use of venlafaxine or gabapentin at this time.  We discussed environmental and dietary modifications to assist with the management of the hot flashes.  She will discuss this further with Dr. Burr Medico at her upcoming appointment.  3. Bone health:  Given Ms. Vaccarella's age/history of breast cancer and her current treatment regimen including endocrine therapy with letrozole, she is at risk for bone demineralization.  Per our records, her last DEXA scan  was performed 09/12/2015 revealing osteopenia. We will continue to monitor this closely while she is on endocrine therapy.  In the meantime, she was encouraged to increase her consumption of foods rich in calcium and vitamin D as well as to increase her weight-bearing activities. We discussed increasing the dose of her vitamin D supplementation to 1000-2000 IU daily. She was given education on specific activities to promote bone health.  4. Cancer screening:  Due to Ms. Lessner's history and her age, she should receive screening for skin cancers and colon cancer.  She is S/P hysterectomy. The information and recommendations are listed on the patient's comprehensive care plan/treatment summary and were reviewed in detail with the patient.    5. Health maintenance and wellness promotion: Ms. Longo was encouraged to consume 5-7 servings of fruits and vegetables per day. We reviewed the "Nutrition Rainbow" handout, as well as discussed recommendations to maximize nutrition and minimize recurrence, such as increased intake of fruits, vegetables, lean proteins, and minimizing the intake of red meats and processed foods.  She was also encouraged to continue to engage in moderate to vigorous exercise for 30 minutes per day most days of the week. We discussed the [employer] fitness program, which is designed for cancer survivors to help them become more physically fit after cancer treatments.  She was instructed to limit her alcohol consumption and continue to abstain from tobacco use.  A copy of the "Take Control of Your Health" brochure was given to her reinforcing these recommendations.   6. Support services/counseling: It is not uncommon for this period of the patient's cancer care trajectory to be one of many emotions and stressors.  We discussed an opportunity for her to participate in the next session of [facility] ("Finding Your New Normal") support group series designed for patients after they have completed  treatment.  Ms. Politano was encouraged to take advantage of our many other support services programs, support groups, and/or counseling in coping with her new life as a cancer survivor after completing anti-cancer treatment. She was offered support today through active listening and expressive supportive counseling. She was given information regarding our available services and encouraged to contact me with  any questions or for help enrolling in any of our support group/programs.    A total of 60 minutes of face-to-face time was spent with this patient with greater than 50% of that time in counseling and care-coordination.   Sylvan Cheese, NP  Survivorship Program [facility] [phone]   Note: PRIMARY CARE PROVIDER Gerrit Heck, [place] [phone]

## 2015-11-21 ENCOUNTER — Ambulatory Visit: Payer: 59 | Admitting: Hematology

## 2015-11-21 ENCOUNTER — Telehealth: Payer: Self-pay | Admitting: Oncology

## 2015-11-21 NOTE — Telephone Encounter (Signed)
Baxter Flattery from [employer] called and needed the diagnosis code to process patient's lymphedema sleeve.  Provided the code.

## 2015-12-21 ENCOUNTER — Other Ambulatory Visit (HOSPITAL_BASED_OUTPATIENT_CLINIC_OR_DEPARTMENT_OTHER): Payer: 59

## 2015-12-21 ENCOUNTER — Telehealth: Payer: Self-pay | Admitting: Hematology

## 2015-12-21 ENCOUNTER — Ambulatory Visit (HOSPITAL_BASED_OUTPATIENT_CLINIC_OR_DEPARTMENT_OTHER): Payer: 59 | Admitting: Hematology

## 2015-12-21 ENCOUNTER — Encounter: Payer: Self-pay | Admitting: Hematology

## 2015-12-21 VITALS — BP 161/100 | HR 86 | Temp 97.9°F | Resp 20 | Ht 66.0 in | Wt 171.3 lb

## 2015-12-21 DIAGNOSIS — Z170 Estrogen receptor positive status [ER+]: Secondary | ICD-10-CM

## 2015-12-21 DIAGNOSIS — I10 Essential (primary) hypertension: Secondary | ICD-10-CM

## 2015-12-21 DIAGNOSIS — M2550 Pain in unspecified joint: Secondary | ICD-10-CM | POA: Diagnosis not present

## 2015-12-21 DIAGNOSIS — Z79811 Long term (current) use of aromatase inhibitors: Secondary | ICD-10-CM

## 2015-12-21 DIAGNOSIS — N951 Menopausal and female climacteric states: Secondary | ICD-10-CM

## 2015-12-21 DIAGNOSIS — E785 Hyperlipidemia, unspecified: Secondary | ICD-10-CM

## 2015-12-21 DIAGNOSIS — C50412 Malignant neoplasm of upper-outer quadrant of left female breast: Secondary | ICD-10-CM

## 2015-12-21 DIAGNOSIS — M8580 Other specified disorders of bone density and structure, unspecified site: Secondary | ICD-10-CM | POA: Insufficient documentation

## 2015-12-21 LAB — COMPREHENSIVE METABOLIC PANEL
ALT: 22 U/L (ref 0–55)
AST: 18 U/L (ref 5–34)
Albumin: 3.5 g/dL (ref 3.5–5.0)
Alkaline Phosphatase: 69 U/L (ref 40–150)
Anion Gap: 10 mEq/L (ref 3–11)
BUN: 17.4 mg/dL (ref 7.0–26.0)
CO2: 27 mEq/L (ref 22–29)
Calcium: 9.2 mg/dL (ref 8.4–10.4)
Chloride: 105 mEq/L (ref 98–109)
Creatinine: 0.9 mg/dL (ref 0.6–1.1)
EGFR: 72 mL/min/{1.73_m2} — ABNORMAL LOW (ref >90)
Glucose: 75 mg/dl (ref 70–140)
Potassium: 3.8 mEq/L (ref 3.5–5.1)
Sodium: 142 mEq/L (ref 136–145)
Total Bilirubin: 1.26 mg/dL — ABNORMAL HIGH (ref 0.20–1.20)
Total Protein: 6.6 g/dL (ref 6.4–8.3)

## 2015-12-21 LAB — CBC WITH DIFFERENTIAL/PLATELET
BASO%: 1.1 % (ref 0.0–2.0)
Basophils Absolute: 0.1 10*3/uL (ref 0.0–0.1)
EOS%: 2 % (ref 0.0–7.0)
Eosinophils Absolute: 0.1 10*3/uL (ref 0.0–0.5)
HCT: 42.9 % (ref 34.8–46.6)
HGB: 14.3 g/dL (ref 11.6–15.9)
LYMPH%: 23.3 % (ref 14.0–49.7)
MCH: 29.4 pg (ref 25.1–34.0)
MCHC: 33.4 g/dL (ref 31.5–36.0)
MCV: 88 fL (ref 79.5–101.0)
MONO#: 0.5 10*3/uL (ref 0.1–0.9)
MONO%: 10.1 % (ref 0.0–14.0)
NEUT%: 63.5 % (ref 38.4–76.8)
NEUTROS ABS: 3 10*3/uL (ref 1.5–6.5)
PLATELETS: 256 10*3/uL (ref 145–400)
RBC: 4.87 10*6/uL (ref 3.70–5.45)
RDW: 13 % (ref 11.2–14.5)
WBC: 4.6 10*3/uL (ref 3.9–10.3)
lymph#: 1.1 10*3/uL (ref 0.9–3.3)

## 2015-12-21 NOTE — Progress Notes (Signed)
[place]  [phone] Fax:([phone]) [phone]  Clinic follow Up Note   Patient Care Team: Leighton Ruff, MD as PCP - General (Family Medicine) Excell Seltzer, MD as Consulting Physician (General Surgery) Truitt Merle, MD as Consulting Physician (Hematology) Arloa Koh, MD as Consulting Physician (Radiation Oncology) Sylvan Cheese, NP as Nurse Practitioner (Hematology and Oncology) Gery Pray, MD as Consulting Physician (Radiation Oncology) 12/21/2015  CHIEF COMPLAINTS:  Follow up left breast cancer   Oncology History   Breast cancer of upper-outer quadrant of left female breast [facility])   Staging form: Breast, AJCC 7th Edition     Clinical: Stage IIA (T2, N0, cM0(i+)) - Unsigned       Breast cancer of upper-outer quadrant of left female breast ([place])   06/21/2015 Mammogram calcification on Mammogram, and a 3cm mass at 3:00 position of left breast on [place], axilla was negative on [place]    06/21/2015 Initial Biopsy Left breast core needle biopsy showed invasive ductal carcinoma, grade 2, and DCIS   06/21/2015 Receptors her2 ER 100% positive, PR 100% positive, HER-2 negative, Ki-67 2%   06/21/2015 Clinical Stage Stage IIA (T2 N0)   07/04/2015 Procedure Breast/Ovarian panel: No clinically significant variants at ATM, BARD1, BRCA1, BRCA2, BRIP1, CDH1, CHEK2, EPCAM, FANCC, MLH1, MSH2, MSH6, NBN, PALB2, PMS2, PTEN, RAD51C, RAD51D, TP53, and XRCC2   07/27/2015 Surgery left lumpectomy and SLN biopsy   07/27/2015 Pathology Results left breast lumpectomy showed G1 IDC, 1.8cm,  (+) DCIS, margins negative. 0/1 LN. Repeat HER2/neu negative (ratio 1.54)   07/27/2015 Oncotype testing RS 4, which predicts 5% 10-y distance recurence risk with Tamoxifen    07/27/2015 Pathologic Stage Stage IA: pT1c pN0   08/31/2015 - 09/28/2015 Radiation Therapy breast adjuvant radiation: Left breast 42.72 cGy 16 fractions, lumpectomy cavity boost 10 gray   10/2015 -  Anti-estrogen oral therapy  Letrozole 2.5 mg daily   11/17/2015 Survivorship Survivorship visit completed and copy given to patient.    HISTORY OF PRESENTING ILLNESS:  Maria Larson 65 y.o. female is here because of her recently diagnosed left breast cancer. She is accompanied by her husband to our multidisciplinary breast clinic today.  The cancer was discovered by screening mammogram. She had ultrasound of breast for follow-up a left breast cyst 6 months ago which was negative except a 2.4 cm cyst. Her mammogram on 06/13/2015 showed a a new area of calcification and ultrasound showed 2.2 cm irregular solid mass in the left breast at 3 clock position, which is new and suspicious for malignancy. She underwent ultrasound-guided core needle biopsy on 06/21/2015 which showed grade 2 invasive ductal carcinoma. ER/PR 100% positive, HER-2 negative.  She had no palpable breast mass before the biopsy. She tolerated the biopsy well, no complications. She feels well overall, denies any significant pain, dyspnea, GI or other symptoms. She is a current Pharmacist, hospital, still works full-time, she is active, bike 3-4 time a week for 20 months.   She had and it etopical pregnancy, was not able to conceive after infertility workup and treatment. She lives with her husband, they have a adopted son who is 97.   CURRENT THERAPY:  letrozole 2.43m daily started in 10/2015  INTERIM HISTORY: NMalureturns for follow-up. She has been on letrozole for 2 months now, she has developed mild fatigue, Still able to tolerate her routine activities without difficulty. She also reports mild to moderate muscle and joint stiffness, and left breast pain sometime, especially when she exerts herself. Her joint stiffness does  improve when she moves around more. she exercises daily, with walking or biking, she takes Aleve once while for the joint discomfort. No other new complaints.   MEDICAL HISTORY:  Past Medical History  Diagnosis Date  . Hypertension   . Breast  cancer of upper-outer quadrant of left female breast ([place]) 06/24/2015  . Breast cancer of upper-outer quadrant of left female breast ([place]) 06/24/2015  . Breast cancer ([place])   . Hyperlipemia   . Family history of breast cancer   . PONV (postoperative nausea and vomiting)   . Interstitial cystitis   . History of hiatal hernia   . Radiation 08/31/2015-09/28/2015    Left breast 42.72 cGy extending fractions, lumpectomy cavity boost 10 gray    SURGICAL HISTORY: Past Surgical History  Procedure Laterality Date  . Abdominal hysterectomy    . Laproscopy      Multiple laparoscopy for infertility issue  . Colonoscopy w/ polypectomy    . Esophagogastroduodenoscopy endoscopy    . Radioactive seed guided mastectomy with axillary sentinel lymph node biopsy Left 07/27/2015    Procedure: RADIOACTIVE SEED GUIDED LEFT LUMPECTOMY  WITH AXILLARY SENTINEL LYMPH NODE BIOPSY;  Surgeon: Excell Seltzer, MD;  Location: [facility];  Service: General;  Laterality: Left;    GYN HISTORY  Menarchal: 16 LMP: hysterectomy at 50  Contraceptive: none  HRT: yes, 14 years, stopped in 05/2015 G1P0:   SOCIAL HISTORY: Social History   Social History  . Marital Status: Married    Spouse Name: N/A  . Number of Children: One adopted sone who is 84   . Years of Education: N/A   Occupational History  . Kindergarten teacher   Social History Main Topics  . Smoking status: Never Smoker   . Smokeless tobacco: Not on file  . Alcohol Use: Yes     Comment: social   . Drug Use: No  . Sexual Activity: Not on file   Other Topics Concern  . Not on file   Social History Narrative    FAMILY HISTORY: Family History  Problem Relation Age of Onset  . Lung cancer Father   . Breast cancer Paternal Aunt     dx over 9  . Breast cancer Paternal Aunt     dx over 42s  . Breast cancer Paternal Aunt     dx over 44  . Kidney disease Maternal Grandfather   . Congestive Heart Failure Paternal Grandmother   . Colon cancer  Paternal Aunt     dx in her 35s    ALLERGIES:  is allergic to sulfa antibiotics.  MEDICATIONS:  Current Outpatient Prescriptions  Medication Sig Dispense Refill  . aspirin 81 MG tablet Take 81 mg by mouth daily.    . Calcium Carb-Cholecalciferol (CALCIUM 600+D) 600-800 MG-UNIT TABS Take 1 tablet by mouth daily.    . Calcium Glycerophosphate (PRELIEF PO) Take 1 tablet by mouth 3 (three) times daily before meals.    . famotidine (PEPCID) 20 MG tablet Take 20 mg by mouth 2 (two) times daily.    Marland Kitchen letrozole (FEMARA) 2.5 MG tablet Take 1 tablet (2.5 mg total) by mouth daily. 30 tablet 2  . Meth-Hyo-M Bl-Na Phos-Ph Sal (URIBEL PO) Take 1 tablet by mouth as needed. Reported on 10/20/2015    . Multiple Vitamin (MULTIVITAMIN) tablet Take 1 tablet by mouth daily.    . ramipril (ALTACE) 10 MG capsule Take 10 mg by mouth daily.    . rosuvastatin (CRESTOR) 10 MG tablet Take 10 mg by mouth  daily.    . hyaluronate sodium (RADIAPLEXRX) GEL Apply 1 application topically 2 (two) times daily. Reported on 0/98/1191    . non-metallic deodorant Jethro Poling) MISC Apply 1 application topically daily as needed. Reported on 12/21/2015     No current facility-administered medications for this visit.    REVIEW OF SYSTEMS:   Constitutional: Denies fevers, chills or abnormal night sweats Eyes: Denies blurriness of vision, double vision or watery eyes Ears, nose, mouth, throat, and face: Denies mucositis or sore throat Respiratory: Denies cough, dyspnea or wheezes Cardiovascular: Denies palpitation, chest discomfort or lower extremity swelling Gastrointestinal:  Denies nausea, heartburn or change in bowel habits Skin: Denies abnormal skin rashes Lymphatics: Denies new lymphadenopathy or easy bruising Neurological:Denies numbness, tingling or new weaknesses Behavioral/Psych: Mood is stable, no new changes  All other systems were reviewed with the patient and are negative.  PHYSICAL EXAMINATION: ECOG PERFORMANCE  STATUS: 1  Filed Vitals:   12/21/15 0841  BP: 161/100  Pulse: 86  Temp: 97.9 F (36.6 C)  Resp: 20   Filed Weights   12/21/15 0841  Weight: 171 lb 4.8 oz (77.701 kg)   GENERAL:alert, no distress and comfortable SKIN: skin color, texture, turgor are normal, no rashes or significant lesions EYES: normal, conjunctiva are pink and non-injected, sclera clear OROPHARYNX:no exudate, no erythema and lips, buccal mucosa, and tongue normal  NECK: supple, thyroid normal size, non-tender, without nodularity LYMPH:  no palpable lymphadenopathy in the cervical, axillary or inguinal LUNGS: clear to auscultation and percussion with normal breathing effort HEART: regular rate & rhythm and no murmurs and no lower extremity edema ABDOMEN:abdomen soft, non-tender and normal bowel sounds Musculoskeletal:no cyanosis of digits and no clubbing  PSYCH: alert & oriented x 3 with fluent speech NEURO: no focal motor/sensory deficits Breasts: Breast inspection showed them to be symmetrical with no nipple discharge. The surgical incisions in the left axilla and around the left nipple has healed well, mild skin erythema and her mentation in the left breast. Palpation of the breasts and axilla revealed no obvious mass that I could appreciate.   LABORATORY DATA:  I have reviewed the data as listed CBC Latest Ref Rng 12/21/2015 10/24/2015 07/20/2015  WBC 3.9 - 10.3 10e3/uL 4.6 5.3 6.4  Hemoglobin 11.6 - 15.9 g/dL 14.3 13.5 14.8  Hematocrit 34.8 - 46.6 % 42.9 40.9 45.3  Platelets 145 - 400 10e3/uL 256 256 293    CMP Latest Ref Rng 12/21/2015 10/24/2015 07/20/2015  Glucose 70 - 140 mg/dl 75 96 108(H)  BUN 7.0 - 26.0 mg/dL 17.4 16.6 13  Creatinine 0.6 - 1.1 mg/dL 0.9 0.8 0.88  Sodium 136 - 145 mEq/L 142 144 141  Potassium 3.5 - 5.1 mEq/L 3.8 3.9 4.1  Chloride 101 - 111 mmol/L - - 108  CO2 22 - 29 mEq/L 27 30(H) 26  Calcium 8.4 - 10.4 mg/dL 9.2 9.0 9.2  Total Protein 6.4 - 8.3 g/dL 6.6 6.5 6.6  Total Bilirubin  0.20 - 1.20 mg/dL 1.26(H) 0.91 1.2  Alkaline Phos 40 - 150 U/L 69 65 65  AST 5 - 34 U/L '18 22 24  ' ALT 0 - 55 U/L '22 23 28     ' PATHOLOGY REPORT: Diagnosis 07/27/2015 1. Breast, lumpectomy, Left - INVASIVE DUCTAL CARCINOMA, GRADE 1/3, SPANNING 1.8 CM. - DUCTAL CARCINOMA IN SITU, LOW GRADE. - LYMPHOVASCULAR INVASION IS IDENTIFIED. - INVASIVE CARCINOMA IS FOCALLY 0.1 CM TO THE ANTERIOR MARGIN OF SPECIMEN #1. - SEE ONCOLOGY TABLE BELOW. 2. Breast, excision, Additional inferior margin,  left - FIBROCYSTIC CHANGES WITH USUAL DUCTAL HYPERPLASIA. - THERE IS NO EVIDENCE OF MALIGNANCY. - SEE COMMENT. 3. Breast, excision, Additional posterior margin, left - FIBROCYSTIC CHANGES WITH USUAL DUCTAL HYPERPLASIA. - THERE IS NO EVIDENCE OF MALIGNANCY. - SEE COMMENT. 4. Lymph node, sentinel, biopsy, Left axillary - THERE IS NO EVIDENCE OF CARCINOMA IN 1 OF 1 LYMPH NODE (0/1). 5. Fatty tissue, Left axillary tissue - BENIGN FIBROADIPOSE TISSUE. - LYMPH NODAL TISSUE IS NOT IDENTIFIED. - THERE IS NO EVIDENCE OF MALIGNANCY.  Microscopic Comment 1. BREAST, INVASIVE TUMOR, WITH LYMPH NODES PRESENT Specimen, including laterality and lymph node sampling (sentinel, non-sentinel): Left breast and left axillary lymph node. Procedure: Seed localized lumpectomy, additional margin resections, and lymph node resection x1. Histologic type: Ductal. Grade: 1. Tubule formation: 3. Nuclear pleomorphism: 1. Mitotic: 1. Tumor size (gross measurement): 1.8 cm. Margins: Invasive, distance to closest margin: Focally 0.1 cm to the anterior margin. In-situ, distance to closest margin: Greater than 0.2 cm to all margins. Lymphovascular invasion: Present. Ductal carcinoma in situ: Present. Grade: Low grade. Extensive intraductal component: Not identified. Lobular neoplasia: Not identified. Tumor focality: Unifocal. Treatment effect: N/A. Extent of tumor: Confined to breast parenchyma. Lymph nodes: Examined: 1  Sentinel 0 Non-sentinel 1 Total Lymph nodes with metastasis: 0. Breast prognostic profile: [id number]. Estrogen receptor: 100%, strong staining intensity. Progesterone receptor: 100%, strong staining intensity. Her 2 neu: No amplification was determined. Her 2 neu by FISH will be repeated on the current case and the results reported separately. Ki-67: 2%. Non-neoplastic breast: Fibrocystic changes with calcifications, usual ductal hyperplasia and healing biopsy site. TNM: pT1c, pN0. 2. , and 3. The surgical resection margin(s) of the specimen were inked and microscopically evaluated. (JBK:ds 07/28/15)  Oncotype RS 4, which predicts 5% 10-y distance recurrence risk with Tamoxifen   RADIOGRAPHIC STUDIES: I have personally reviewed the radiological images as listed and agreed with the findings in the report.  DEXA 09/12/2015  iMPRESSION:   Based on the WHO criteria this patient has osteopenia  Left femur neck T score -2.3, -13% change since February 2009   10 year probability of major  Osteoporotic fracture is 27% and hip fracture is 1.5%  ASSESSMENT & PLAN:  65 year old Caucasian postmenopausal female, with past medical history of hypertension,dyslipidemia, presented with screening discovered left breast cancer.  1. Breast cancer of upper outer quadrant of left breast, pT1cN0M0, stage Ia,  ER and PR 100% positive, HER-2 negative, Ki67 2%, (+) DCIS, low RS 4 --We discussed her surgical pathology findings in great details with patient and her husband. -I reviewed her Oncotype DX test results, which showed a low risk recurrence score (4), predicts 5% 10 year distance recurrence risk with tamoxifen alone. Given the low risk of recurrence, adjuvant chemotherapy was not recommended. -She is on adjuvant letrozole, tolerating moderately well, has developed arthralgia, but still manageable. We'll continue. -If her arthralgia gets worse, I would consider switching her to exemestane or  tamoxifen. -We'll continue breast cancer surveillance plan, Including annual mammogram, physical exam including breast exam every 6-12 months, and routine follow-up and lab test.   2. Arthralgia -She has developed mild-to-moderate arthralgia, I encouraged her to exercise regularly, and take NSAIDs as needed. She can also try acupuncture if gets worse. -It's manageable so far, we'll continue monitoring.  3. Hot flush -Started when she stopped hormonal replacement in November 2016. It has improved over time. -We discussed that her hot flash is likely going get worse with tamoxifen. I. She given her a prescription  of Effexor, she has not started, but knows to use it if needed.  4. Osteopenia  -Her recent bone density scan in [place] showed osteopenia with a high risk for fracture -We discussed that letrozole will probably worsen her bone density, and we'll monitor her bone density every 2 years  -I encouraged her to continue calcium and vitamin D, she is very compliant  -I encouraged her to continue weightbearing exercise -I recommend her to consider propria every 6 months to stress her bone. Potential benefits and side effects, especially to necrosis, were discussed with her come she agrees to proceed. -She has had routine dental follow-up, no pending dental work  5. Genetic -she was seen by genetic counselor in our cancer center, and her genetic testing for inheritable breast and ovarian syndrome panel was negative.  6. Hypertension and dyslipidemia -She will continue follow-up with her permit care physician  Plan -Continue letrozole -Start propria in 1 week and continue every 6 months -I'll see her back in 3 months -She knows to call me if her arthralgia gets worse  All questions were answered. The patient knows to call the clinic with any problems, questions or concerns. I spent 30 minutes counseling the patient face to face. The total time spent in the appointment was 35 minutes and  more than 50% was on counseling.     Truitt Merle, MD 12/21/2015 7:17 PM

## 2015-12-21 NOTE — Telephone Encounter (Signed)
Gave and printed appt sched and avs for pt for June and Aug °

## 2015-12-28 ENCOUNTER — Ambulatory Visit (HOSPITAL_BASED_OUTPATIENT_CLINIC_OR_DEPARTMENT_OTHER): Payer: 59

## 2015-12-28 VITALS — BP 158/85 | HR 99 | Temp 98.0°F | Resp 20

## 2015-12-28 DIAGNOSIS — M8580 Other specified disorders of bone density and structure, unspecified site: Secondary | ICD-10-CM

## 2015-12-28 MED ORDER — DENOSUMAB 60 MG/ML ~~LOC~~ SOLN
60.0000 mg | Freq: Once | SUBCUTANEOUS | Status: AC
  Administered 2015-12-28: 60 mg via SUBCUTANEOUS
  Filled 2015-12-28: qty 1

## 2015-12-28 NOTE — Patient Instructions (Signed)
Denosumab injection  What is this medicine?  DENOSUMAB (den oh sue mab) slows bone breakdown. Prolia is used to treat osteoporosis in women after menopause and in men. Xgeva is used to prevent bone fractures and other bone problems caused by cancer bone metastases. Xgeva is also used to treat giant cell tumor of the bone.  This medicine may be used for other purposes; ask your health care provider or pharmacist if you have questions.  What should I tell my health care provider before I take this medicine?  They need to know if you have any of these conditions:  -dental disease  -eczema  -infection or history of infections  -kidney disease or on dialysis  -low blood calcium or vitamin D  -malabsorption syndrome  -scheduled to have surgery or tooth extraction  -taking medicine that contains denosumab  -thyroid or parathyroid disease  -an unusual reaction to denosumab, other medicines, foods, dyes, or preservatives  -pregnant or trying to get pregnant  -breast-feeding  How should I use this medicine?  This medicine is for injection under the skin. It is given by a health care professional in a hospital or clinic setting.  If you are getting Prolia, a special MedGuide will be given to you by the pharmacist with each prescription and refill. Be sure to read this information carefully each time.  For Prolia, talk to your pediatrician regarding the use of this medicine in children. Special care may be needed. For Xgeva, talk to your pediatrician regarding the use of this medicine in children. While this drug may be prescribed for children as young as 13 years for selected conditions, precautions do apply.  Overdosage: If you think you have taken too much of this medicine contact a poison control center or emergency room at once.  NOTE: This medicine is only for you. Do not share this medicine with others.  What if I miss a dose?  It is important not to miss your dose. Call your doctor or health care professional if you are  unable to keep an appointment.  What may interact with this medicine?  Do not take this medicine with any of the following medications:  -other medicines containing denosumab  This medicine may also interact with the following medications:  -medicines that suppress the immune system  -medicines that treat cancer  -steroid medicines like prednisone or cortisone  This list may not describe all possible interactions. Give your health care provider a list of all the medicines, herbs, non-prescription drugs, or dietary supplements you use. Also tell them if you smoke, drink alcohol, or use illegal drugs. Some items may interact with your medicine.  What should I watch for while using this medicine?  Visit your doctor or health care professional for regular checks on your progress. Your doctor or health care professional may order blood tests and other tests to see how you are doing.  Call your doctor or health care professional if you get a cold or other infection while receiving this medicine. Do not treat yourself. This medicine may decrease your body's ability to fight infection.  You should make sure you get enough calcium and vitamin D while you are taking this medicine, unless your doctor tells you not to. Discuss the foods you eat and the vitamins you take with your health care professional.  See your dentist regularly. Brush and floss your teeth as directed. Before you have any dental work done, tell your dentist you are receiving this medicine.  Do   not become pregnant while taking this medicine or for 5 months after stopping it. Women should inform their doctor if they wish to become pregnant or think they might be pregnant. There is a potential for serious side effects to an unborn child. Talk to your health care professional or pharmacist for more information.  What side effects may I notice from receiving this medicine?  Side effects that you should report to your doctor or health care professional as soon as  possible:  -allergic reactions like skin rash, itching or hives, swelling of the face, lips, or tongue  -breathing problems  -chest pain  -fast, irregular heartbeat  -feeling faint or lightheaded, falls  -fever, chills, or any other sign of infection  -muscle spasms, tightening, or twitches  -numbness or tingling  -skin blisters or bumps, or is dry, peels, or red  -slow healing or unexplained pain in the mouth or jaw  -unusual bleeding or bruising  Side effects that usually do not require medical attention (Report these to your doctor or health care professional if they continue or are bothersome.):  -muscle pain  -stomach upset, gas  This list may not describe all possible side effects. Call your doctor for medical advice about side effects. You may report side effects to FDA at 1-800-FDA-1088.  Where should I keep my medicine?  This medicine is only given in a clinic, doctor's office, or other health care setting and will not be stored at home.  NOTE: This sheet is a summary. It may not cover all possible information. If you have questions about this medicine, talk to your doctor, pharmacist, or health care provider.      2016, Elsevier/Gold Standard. (2012-01-07 12:37:47)

## 2016-01-20 ENCOUNTER — Other Ambulatory Visit: Payer: 59

## 2016-01-20 ENCOUNTER — Ambulatory Visit: Payer: 59 | Admitting: Hematology

## 2016-01-21 ENCOUNTER — Other Ambulatory Visit: Payer: Self-pay | Admitting: Hematology

## 2016-01-25 ENCOUNTER — Other Ambulatory Visit: Payer: Self-pay | Admitting: *Deleted

## 2016-01-25 DIAGNOSIS — C50412 Malignant neoplasm of upper-outer quadrant of left female breast: Secondary | ICD-10-CM

## 2016-01-25 MED ORDER — LETROZOLE 2.5 MG PO TABS: 2.5000 mg | ORAL_TABLET | Freq: Every day | ORAL | Status: DC

## 2016-02-16 ENCOUNTER — Encounter: Payer: Self-pay | Admitting: Radiation Oncology

## 2016-02-16 ENCOUNTER — Ambulatory Visit
Admission: RE | Admit: 2016-02-16 | Discharge: 2016-02-16 | Disposition: A | Discharge status: Home / Self Care | Payer: 59 | Source: Ambulatory Visit | Attending: Radiation Oncology | Admitting: Radiation Oncology

## 2016-02-16 DIAGNOSIS — C50412 Malignant neoplasm of upper-outer quadrant of left female breast: Secondary | ICD-10-CM | POA: Diagnosis not present

## 2016-02-16 DIAGNOSIS — Z7982 Long term (current) use of aspirin: Secondary | ICD-10-CM | POA: Insufficient documentation

## 2016-02-16 DIAGNOSIS — Y842 Radiological procedure and radiotherapy as the cause of abnormal reaction of the patient, or of later complication, without mention of misadventure at the time of the procedure: Secondary | ICD-10-CM | POA: Insufficient documentation

## 2016-02-16 NOTE — Progress Notes (Signed)
Maria Larson here for follow up.  She continues to have pain in her left breast.  She said it especially hurts when she lays on the side at night.  She reports her left breast is warmer than the right.  She recently went [place] in the mountains and felt pain in her throat.  She is taking letrozole and has had joint stiffness.  She reports having fatigue on and off.  The skin on her left breast is intact.  She has slight hyperpigmentation underneath her left breast.  BP (!) 145/86 (BP Location: Right Arm, Patient Position: Sitting)   Pulse (!) 105   Temp 98.1 F (36.7 C) (Oral)   Ht 5\' 6"  (1.676 m)   Wt 169 lb 14.4 oz (77.1 kg)   SpO2 100%   BMI 27.42 kg/m    Wt Readings from Last 3 Encounters:  02/16/16 169 lb 14.4 oz (77.1 kg)  12/21/15 171 lb 4.8 oz (77.7 kg)  11/17/15 169 lb 9.6 oz (76.9 kg)

## 2016-02-16 NOTE — Progress Notes (Signed)
.   Radiation Oncology         [phone] ________________________________  Name: Maria Larson MRN: [id number]  Date: 02/16/2016  DOB: 08-01-1950  Follow-Up Visit Note  CC: Gerrit Heck, MD  Excell Seltzer, MD    Diagnosis: Breast cancer of upper-outer quadrant of left female breast [facility])   Staging form: Breast, AJCC 7th Edition     Clinical stage from 06/21/2015: Stage IIA (T2, N0, cM0(i+)) - Signed by Truitt Merle, MD on 06/29/2015  Interval Since Last Radiation: 4 months   08/31/2015-09/28/2015: Left breast 42.72 cGy 16 fractions, lumpectomy cavity boost 10 gray  Narrative:  The patient returns today for routine follow-up. She continues to have pain in her left breast.  She said it especially hurts when she lays on the side at night.  She reports her left breast is warmer than the right.  She recently went hiking in the mountains and felt pain in her throat.  She is taking letrozole and has had joint stiffness.  She reports having fatigue on and off.  The skin on her left breast is intact.  She has slight hyperpigmentation underneath her left breast. Patient denies nipple discharge and bleeding.  The patient reports that her breast pain is persisted, but is unaware whether or not this is intensified during physical activity. The patient also reported a upper chest pain that ensued during strenuous physical activity from her hiking trip.   ALLERGIES:  is allergic to sulfa antibiotics.  Meds: Current Outpatient Prescriptions  Medication Sig Dispense Refill  . aspirin 81 MG tablet Take 81 mg by mouth daily.    . Calcium Carb-Cholecalciferol (CALCIUM 600+D) 600-800 MG-UNIT TABS Take 1 tablet by mouth daily.    . Calcium Glycerophosphate (PRELIEF PO) Take 1 tablet by mouth 3 (three) times daily before meals.    . famotidine (PEPCID) 20 MG tablet Take 20 mg by mouth 2 (two) times daily.    Marland Kitchen letrozole (FEMARA) 2.5 MG tablet Take 1 tablet (2.5 mg total) by mouth daily. 90 tablet  1  . Meth-Hyo-M Bl-Na Phos-Ph Sal (URIBEL PO) Take 1 tablet by mouth as needed. Reported on 10/20/2015    . Multiple Vitamin (MULTIVITAMIN) tablet Take 1 tablet by mouth daily.    . ramipril (ALTACE) 10 MG capsule Take 10 mg by mouth daily.    . rosuvastatin (CRESTOR) 10 MG tablet Take 10 mg by mouth daily.    . hyaluronate sodium (RADIAPLEXRX) GEL Apply 1 application topically 2 (two) times daily. Reported on 0000000    . non-metallic deodorant Jethro Poling) MISC Apply 1 application topically daily as needed. Reported on 12/21/2015     No current facility-administered medications for this encounter.     Physical Findings: The patient is in no acute distress. Patient is alert and oriented.  height is 5\' 6"  (1.676 m) and weight is 169 lb 14.4 oz (77.1 kg). Her oral temperature is 98.1 F (36.7 C). Her blood pressure is 145/86 (abnormal) and her pulse is 105 (abnormal). Her oxygen saturation is 100%.   Skin is well healed. Mild hyperpigmentation and mild edema Of the left breast . Induration just superior to the lumpectomy scar which is a little uncomfortable with palpation. Lungs are clear to auscultation bilaterally. Heart has regular rate and rhythm. No palpable cervical, supraclavicular, or axillary adenopathy. Abdomen soft, non-tender, normal bowel sounds.   Lab Findings: Lab Results  Component Value Date   WBC 4.6 12/21/2015   HGB 14.3 12/21/2015   HCT  42.9 12/21/2015   MCV 88.0 12/21/2015   PLT 256 12/21/2015    Radiographic Findings: No results found.  Impression:  The patient is recovering from the effects of radiation. No evidence of recurrence on clinical exam. The patient has persistent soreness on the breast. This does not require pain medication however.  This will hopefully improve with time.   Plan: Call or return to clinic prn if these symptoms worsen or fail to improve as anticipated with Radiation Oncology.  The patient will continue to follow up with Dr. Burr Medico for the next  5 years.  I will follow up as needed.   ____________________________________ -----------------------------------  Blair Promise, PhD, MD  This document serves as a record of services personally performed by Gery Pray, MD. It was created on his behalf by Truddie Hidden, a trained medical scribe. The creation of this record is based on the scribe's personal observations and the provider's statements to them. This document has been checked and approved by the attending provider.

## 2016-03-22 ENCOUNTER — Encounter: Payer: Self-pay | Admitting: Hematology

## 2016-03-22 ENCOUNTER — Other Ambulatory Visit (HOSPITAL_BASED_OUTPATIENT_CLINIC_OR_DEPARTMENT_OTHER): Payer: 59

## 2016-03-22 ENCOUNTER — Ambulatory Visit (HOSPITAL_BASED_OUTPATIENT_CLINIC_OR_DEPARTMENT_OTHER): Payer: 59 | Admitting: Hematology

## 2016-03-22 ENCOUNTER — Telehealth: Payer: Self-pay | Admitting: Hematology

## 2016-03-22 VITALS — BP 159/98 | HR 93 | Temp 98.3°F | Resp 18 | Ht 66.0 in | Wt 172.1 lb

## 2016-03-22 DIAGNOSIS — M8580 Other specified disorders of bone density and structure, unspecified site: Secondary | ICD-10-CM

## 2016-03-22 DIAGNOSIS — I10 Essential (primary) hypertension: Secondary | ICD-10-CM | POA: Diagnosis not present

## 2016-03-22 DIAGNOSIS — Z170 Estrogen receptor positive status [ER+]: Secondary | ICD-10-CM

## 2016-03-22 DIAGNOSIS — C50412 Malignant neoplasm of upper-outer quadrant of left female breast: Secondary | ICD-10-CM

## 2016-03-22 LAB — CBC WITH DIFFERENTIAL/PLATELET
BASO%: 0.6 % (ref 0.0–2.0)
Basophils Absolute: 0 10*3/uL (ref 0.0–0.1)
EOS ABS: 0 10*3/uL (ref 0.0–0.5)
EOS%: 0.6 % (ref 0.0–7.0)
HCT: 40.3 % (ref 34.8–46.6)
HGB: 13.6 g/dL (ref 11.6–15.9)
LYMPH%: 23.3 % (ref 14.0–49.7)
MCH: 29.9 pg (ref 25.1–34.0)
MCHC: 33.8 g/dL (ref 31.5–36.0)
MCV: 88.4 fL (ref 79.5–101.0)
MONO#: 0.7 10*3/uL (ref 0.1–0.9)
MONO%: 10.6 % (ref 0.0–14.0)
NEUT%: 64.9 % (ref 38.4–76.8)
NEUTROS ABS: 4 10*3/uL (ref 1.5–6.5)
Platelets: 275 10*3/uL (ref 145–400)
RBC: 4.56 10*6/uL (ref 3.70–5.45)
RDW: 13.2 % (ref 11.2–14.5)
WBC: 6.2 10*3/uL (ref 3.9–10.3)
lymph#: 1.4 10*3/uL (ref 0.9–3.3)

## 2016-03-22 LAB — COMPREHENSIVE METABOLIC PANEL
ALT: 16 U/L (ref 0–55)
AST: 17 U/L (ref 5–34)
Albumin: 3.4 g/dL — ABNORMAL LOW (ref 3.5–5.0)
Alkaline Phosphatase: 69 U/L (ref 40–150)
Anion Gap: 9 mEq/L (ref 3–11)
BILIRUBIN TOTAL: 0.77 mg/dL (ref 0.20–1.20)
BUN: 17.4 mg/dL (ref 7.0–26.0)
CHLORIDE: 106 meq/L (ref 98–109)
CO2: 27 meq/L (ref 22–29)
Calcium: 9.2 mg/dL (ref 8.4–10.4)
Creatinine: 0.8 mg/dL (ref 0.6–1.1)
EGFR: 78 mL/min/{1.73_m2} — AB (ref >90)
GLUCOSE: 99 mg/dL (ref 70–140)
Potassium: 3.9 mEq/L (ref 3.5–5.1)
SODIUM: 142 meq/L (ref 136–145)
TOTAL PROTEIN: 7 g/dL (ref 6.4–8.3)

## 2016-03-22 MED ORDER — TAMOXIFEN CITRATE 20 MG PO TABS: 20.0000 mg | ORAL_TABLET | Freq: Every day | ORAL | 2 refills | Status: DC

## 2016-03-22 NOTE — Progress Notes (Signed)
[name]  [phone] [phone]  Clinic follow Up Note   Patient Care Team: Leighton Ruff, MD as PCP - General (Family Medicine) Excell Seltzer, MD as Consulting Physician (General Surgery) Truitt Merle, MD as Consulting Physician (Hematology) Arloa Koh, MD as Consulting Physician (Radiation Oncology) Sylvan Cheese, NP as Nurse Practitioner (Hematology and Oncology) Gery Pray, MD as Consulting Physician (Radiation Oncology) 03/22/2016  CHIEF COMPLAINTS:  Follow up left breast cancer   Oncology History   Breast cancer of upper-outer quadrant of left female breast [facility])   Staging form: Breast, AJCC 7th Edition     Clinical: Stage IIA (T2, N0, cM0(i+)) - Unsigned       Breast cancer of upper-outer quadrant of left female breast ([place])   06/21/2015 Mammogram    calcification on Mammogram, and a 3cm mass at 3:00 position of left breast on [place], axilla was negative on [place]       06/21/2015 Initial Biopsy    Left breast core needle biopsy showed invasive ductal carcinoma, grade 2, and DCIS      06/21/2015 Receptors her2    ER 100% positive, PR 100% positive, HER-2 negative, Ki-67 2%      06/21/2015 Clinical Stage    Stage IIA (T2 N0)      07/04/2015 Procedure    Breast/Ovarian panel: No clinically significant variants at ATM, BARD1, BRCA1, BRCA2, BRIP1, CDH1, CHEK2, EPCAM, FANCC, MLH1, MSH2, MSH6, NBN, PALB2, PMS2, PTEN, RAD51C, RAD51D, TP53, and XRCC2      07/27/2015 Surgery    left lumpectomy and SLN biopsy      07/27/2015 Pathology Results    left breast lumpectomy showed G1 IDC, 1.8cm,  (+) DCIS, margins negative. 0/1 LN. Repeat HER2/neu negative (ratio 1.54)      07/27/2015 Oncotype testing    RS 4, which predicts 5% 10-y distance recurence risk with Tamoxifen       07/27/2015 Pathologic Stage    Stage IA: pT1c pN0      08/31/2015 - 09/28/2015 Radiation Therapy    breast adjuvant radiation: Left breast 42.72 cGy  16 fractions, lumpectomy cavity boost 10 gray      10/2015 -  Anti-estrogen oral therapy    Letrozole 2.5 mg daily      11/17/2015 Survivorship    Survivorship visit completed and copy given to patient.       HISTORY OF PRESENTING ILLNESS:  Maria Larson 65 y.o. female is here because of her recently diagnosed left breast cancer. She is accompanied by her husband to our multidisciplinary breast clinic today.  The cancer was discovered by screening mammogram. She had ultrasound of breast for follow-up a left breast cyst 6 months ago which was negative except a 2.4 cm cyst. Her mammogram on 06/13/2015 showed a a new area of calcification and ultrasound showed 2.2 cm irregular solid mass in the left breast at 3 clock position, which is new and suspicious for malignancy. She underwent ultrasound-guided core needle biopsy on 06/21/2015 which showed grade 2 invasive ductal carcinoma. ER/PR 100% positive, HER-2 negative.  She had no palpable breast mass before the biopsy. She tolerated the biopsy well, no complications. She feels well overall, denies any significant pain, dyspnea, GI or other symptoms. She is a current Pharmacist, hospital, still works full-time, she is active, bike 3-4 time a week for 20 months.   She had and it etopical pregnancy, was not able to conceive after infertility workup and treatment. She lives with her husband, they  have a adopted son who is 42.   CURRENT THERAPY:  letrozole 2.49m daily started in 10/2015  INTERIM HISTORY: NEsperansareturns for follow-up. She has been compliant with letrozole, and he complains of worsening joint stiffness and pain, especially at her back and hips, is hard for her to stand up from chair or floor without pushing with her hands. Her symptoms improved after she walks around. She also reports intermittent mild low abdominal pain, without any dysuria or urinary frequency. She also has some pain at the left breast incision sites, with tenderness, no recent  trauma. She does wear a bra prosthesis some time. She has good appetite, eating well, remains to be physically active.  MEDICAL HISTORY:  Past Medical History:  Diagnosis Date  [place]   . Breast cancer of [place] quadrant of [place] breast [place] 06/24/2015  . Breast cancer of [place] of [place] female breast [place] 06/24/2015  . Family history of breast cancer   . History of hiatal hernia   . Hyperlipemia   . Hypertension   . Interstitial cystitis   . PONV (postoperative nausea and vomiting)   . Radiation 08/31/2015-09/28/2015   Left breast 42.72 cGy extending fractions, lumpectomy cavity boost 10 gray    SURGICAL HISTORY: Past Surgical History:  Procedure Laterality Date  . ABDOMINAL HYSTERECTOMY    . COLONOSCOPY W/ POLYPECTOMY    . ESOPHAGOGASTRODUODENOSCOPY ENDOSCOPY    . laproscopy     Multiple laparoscopy for infertility issue  . RADIOACTIVE SEED GUIDED MASTECTOMY WITH AXILLARY SENTINEL LYMPH NODE BIOPSY Left 07/27/2015   Procedure: RADIOACTIVE SEED GUIDED LEFT LUMPECTOMY  WITH AXILLARY SENTINEL LYMPH NODE BIOPSY;  Surgeon: BExcell Seltzer MD;  [place] [facility]  Service: General;  Laterality: Left;    GYN HISTORY  Menarchal: 16 LMP: hysterectomy at 50  Contraceptive: none  HRT: yes, 14 years, stopped in 05/2015 G1P0:   SOCIAL HISTORY: Social History   Social History  . Marital Status: Married    Spouse Name: N/A  . Number of Children: One adopted sone who is 278  . Years of Education: N/A   Occupational History  . Kindergarten teacher   Social History Main Topics  . Smoking status: Never Smoker   . Smokeless tobacco: Not on file  . Alcohol Use: Yes     Comment: social   . Drug Use: No  . Sexual Activity: Not on file   Other Topics Concern  . Not on file   Social History Narrative    FAMILY HISTORY: Family History  Problem Relation Age of Onset  . Lung cancer Father   . Breast cancer Paternal Aunt     dx over 737 .  Breast cancer Paternal Aunt     dx over 765s . Breast cancer Paternal Aunt     dx over 74 . Kidney disease Maternal Grandfather   . Congestive Heart Failure Paternal Grandmother   . Colon cancer Paternal Aunt     dx in her 714s   ALLERGIES:  is allergic to sulfa antibiotics.  MEDICATIONS:  Current Outpatient Prescriptions  Medication Sig Dispense Refill  . aspirin 81 MG tablet Take 81 mg by mouth daily.    . Calcium Carb-Cholecalciferol (CALCIUM 600+D) 600-800 MG-UNIT TABS Take 1 tablet by mouth daily.    . famotidine (PEPCID) 20 MG tablet Take 20 mg by mouth 2 (two) times daily.    .Marland Kitchenletrozole (FEMARA) 2.5 MG tablet Take 1 tablet (2.5  mg total) by mouth daily. 90 tablet 1  . Meth-Hyo-M Bl-Na Phos-Ph Sal (URIBEL PO) Take 1 tablet by mouth as needed. Reported on 10/20/2015    . Multiple Vitamin (MULTIVITAMIN) tablet Take 1 tablet by mouth daily.    . ramipril (ALTACE) 10 MG capsule Take 10 mg by mouth daily.    . rosuvastatin (CRESTOR) 10 MG tablet Take 10 mg by mouth daily.    . Vaginal Lubricant (REPLENS) GEL Place vaginally.    . Calcium Glycerophosphate (PRELIEF PO) Take 1 tablet by mouth 3 (three) times daily before meals.    . hyaluronate sodium (RADIAPLEXRX) GEL Apply 1 application topically 2 (two) times daily. Reported on 4/81/8563    . non-metallic deodorant Jethro Poling) MISC Apply 1 application topically daily as needed. Reported on 12/21/2015    . tamoxifen (NOLVADEX) 20 MG tablet Take 1 tablet (20 mg total) by mouth daily. 30 tablet 2   No current facility-administered medications for this visit.     REVIEW OF SYSTEMS:   Constitutional: Denies fevers, chills or abnormal night sweats Eyes: Denies blurriness of vision, double vision or watery eyes Ears, nose, mouth, throat, and face: Denies mucositis or sore throat Respiratory: Denies cough, dyspnea or wheezes Cardiovascular: Denies palpitation, chest discomfort or lower extremity swelling Gastrointestinal:  Denies nausea,  heartburn or change in bowel habits Skin: Denies abnormal skin rashes Lymphatics: Denies new lymphadenopathy or easy bruising Neurological:Denies numbness, tingling or new weaknesses Behavioral/Psych: Mood is stable, no new changes  All other systems were reviewed with the patient and are negative.  PHYSICAL EXAMINATION: ECOG PERFORMANCE STATUS: 1  Vitals:   03/22/16 1525  BP: (!) 159/98  Pulse: 93  Resp: 18  Temp: 98.3 F (36.8 C)   Filed Weights   03/22/16 1525  Weight: 172 lb 1.6 oz (78.1 kg)   GENERAL:alert, no distress and comfortable SKIN: skin color, texture, turgor are normal, no rashes or significant lesions EYES: normal, conjunctiva are pink and non-injected, sclera clear OROPHARYNX:no exudate, no erythema and lips, buccal mucosa, and tongue normal  NECK: supple, thyroid normal size, non-tender, without nodularity LYMPH:  no palpable lymphadenopathy in the cervical, axillary or inguinal LUNGS: clear to auscultation and percussion with normal breathing effort HEART: regular rate & rhythm and no murmurs and no lower extremity edema ABDOMEN:abdomen soft, non-tender and normal bowel sounds Musculoskeletal:no cyanosis of digits and no clubbing  PSYCH: alert & oriented x 3 with fluent speech NEURO: no focal motor/sensory deficits Breasts: Breast inspection showed them to be symmetrical with no nipple discharge. The surgical incisions in the left axilla and around the left nipple has healed well, mild skin erythema and her mentation in the left breast. Palpation of the breasts and axilla revealed no obvious mass that I could appreciate.   LABORATORY DATA:  I have reviewed the data as listed CBC Latest Ref Rng & Units 03/22/2016 12/21/2015 10/24/2015  WBC 3.9 - 10.3 10e3/uL 6.2 4.6 5.3  Hemoglobin 11.6 - 15.9 g/dL 13.6 14.3 13.5  Hematocrit 34.8 - 46.6 % 40.3 42.9 40.9  Platelets 145 - 400 10e3/uL 275 256 256    CMP Latest Ref Rng & Units 03/22/2016 12/21/2015 10/24/2015    Glucose 70 - 140 mg/dl 99 75 96  BUN 7.0 - 26.0 mg/dL 17.4 17.4 16.6  Creatinine 0.6 - 1.1 mg/dL 0.8 0.9 0.8  Sodium 136 - 145 mEq/L 142 142 144  Potassium 3.5 - 5.1 mEq/L 3.9 3.8 3.9  Chloride 101 - 111 mmol/L - - -  CO2 22 - 29 mEq/L 27 27 30(H)  Calcium 8.4 - 10.4 mg/dL 9.2 9.2 9.0  Total Protein 6.4 - 8.3 g/dL 7.0 6.6 6.5  Total Bilirubin 0.20 - 1.20 mg/dL 0.77 1.26(H) 0.91  Alkaline Phos 40 - 150 U/L 69 69 65  AST 5 - 34 U/L '17 18 22  ' ALT 0 - 55 U/L '16 22 23     ' PATHOLOGY REPORT: Diagnosis 07/27/2015 1. Breast, lumpectomy, Left - INVASIVE DUCTAL CARCINOMA, GRADE 1/3, SPANNING 1.8 CM. - DUCTAL CARCINOMA IN SITU, LOW GRADE. - LYMPHOVASCULAR INVASION IS IDENTIFIED. - INVASIVE CARCINOMA IS FOCALLY 0.1 CM TO THE ANTERIOR MARGIN OF SPECIMEN #1. - SEE ONCOLOGY TABLE BELOW. 2. Breast, excision, Additional inferior margin, left - FIBROCYSTIC CHANGES WITH USUAL DUCTAL HYPERPLASIA. - THERE IS NO EVIDENCE OF MALIGNANCY. - SEE COMMENT. 3. Breast, excision, Additional posterior margin, left - FIBROCYSTIC CHANGES WITH USUAL DUCTAL HYPERPLASIA. - THERE IS NO EVIDENCE OF MALIGNANCY. - SEE COMMENT. 4. Lymph node, sentinel, biopsy, Left axillary - THERE IS NO EVIDENCE OF CARCINOMA IN 1 OF 1 LYMPH NODE (0/1). 5. Fatty tissue, Left axillary tissue - BENIGN FIBROADIPOSE TISSUE. - LYMPH NODAL TISSUE IS NOT IDENTIFIED. - THERE IS NO EVIDENCE OF MALIGNANCY.  Microscopic Comment 1. BREAST, INVASIVE TUMOR, WITH LYMPH NODES PRESENT Specimen, including laterality and lymph node sampling (sentinel, non-sentinel): Left breast and left axillary lymph node. Procedure: Seed localized lumpectomy, additional margin resections, and lymph node resection x1. Histologic type: Ductal. Grade: 1. Tubule formation: 3. Nuclear pleomorphism: 1. Mitotic: 1. Tumor size (gross measurement): 1.8 cm. Margins: Invasive, distance to closest margin: Focally 0.1 cm to the anterior margin. In-situ, distance to  closest margin: Greater than 0.2 cm to all margins. Lymphovascular invasion: Present. Ductal carcinoma in situ: Present. Grade: Low grade. Extensive intraductal component: Not identified. Lobular neoplasia: Not identified. Tumor focality: Unifocal. Treatment effect: N/A. Extent of tumor: Confined to breast parenchyma. Lymph nodes: Examined: 1 Sentinel 0 Non-sentinel 1 Total Lymph nodes with metastasis: 0. Breast prognostic profile: [id number]. Estrogen receptor: 100%, strong staining intensity. Progesterone receptor: 100%, strong staining intensity. Her 2 neu: No amplification was determined. Her 2 neu by FISH will be repeated on the current case and the results reported separately. Ki-67: 2%. Non-neoplastic breast: Fibrocystic changes with calcifications, usual ductal hyperplasia and healing biopsy site. TNM: pT1c, pN0. 2. , and 3. The surgical resection margin(s) of the specimen were inked and microscopically evaluated. (JBK:ds 07/28/15)  Oncotype RS 4, which predicts 5% 10-y distance recurrence risk with Tamoxifen   RADIOGRAPHIC STUDIES: I have personally reviewed the radiological images as listed and agreed with the findings in the report.  DEXA 09/12/2015  iMPRESSION:   Based on the WHO criteria this patient has osteopenia  Left femur neck T score -2.3, -13% change since February 2009   10 year probability of major  Osteoporotic fracture is 27% and hip fracture is 1.5%  ASSESSMENT & PLAN:  65 year old Caucasian postmenopausal female, with past medical history of hypertension,dyslipidemia, presented with screening discovered left breast cancer.  1. Breast cancer of upper outer quadrant of left breast, pT1cN0M0, stage Ia,  ER and PR 100% positive, HER-2 negative, Ki67 2%, (+) DCIS, low RS 4 --We discussed her surgical pathology findings in great details with patient and her husband. -I reviewed her Oncotype DX test results, which showed a low risk recurrence score (4),  predicts 5% 10 year distance recurrence risk with tamoxifen alone. Given the low risk of recurrence, adjuvant chemotherapy was not recommended. -She is  on adjuvant letrozole, has developed moderate arthralgia with stiffness, we discussed switching her to exemestane or tamoxifen. She has had hysterectomy, opted to switch tamoxifen. I called into her pharmacy today --I'll stop letrozole, if her symptoms improve in the next 2-4 weeks, we'll start her on tamoxifen. -We'll continue breast cancer surveillance plan, Including annual mammogram, physical exam including breast exam every 6-12 months, and routine follow-up and lab test.   2. Arthralgia -She has developed worsening moderate arthralgia, I encouraged her to exercise regularly, and take NSAIDs as needed. She can also try acupuncture if gets worse. -I'll stop letrozole, if her symptoms improve in the next 2-4 weeks, we'll start her on tamoxifen.  3. Hot flush -Started when she stopped hormonal replacement in November 2016. It has improved over time. Tolerable now  4. Left breast pain -Possible related to her bra prosthesis, and or possible letrozole  -I recommend her to use sports bra, avoid using prosthesis for now  -She knows to use NSAIDs as needed  5. Osteopenia  -Her recent bone density scan in [facility] showed osteopenia with a high risk for fracture -We discussed that letrozole will probably worsen her bone density, and we'll monitor her bone density every 2 years  -I encouraged her to continue calcium and vitamin D, she is very compliant  -I encouraged her to continue weightbearing exercise -She has started to primary, tolerating well. Will continue every 6 months  6. Genetic -she was seen by genetic counselor in our cancer center, and her genetic testing for inheritable breast and ovarian syndrome panel was negative.  7 Hypertension and dyslipidemia -She will continue follow-up with her permit care physician  Plan -stop  letrozole -If her arthralgia improves in the next 2-4 weeks, she will start tamoxifen, which was called in to her pharmacy today  -continue propria every 6 months, next due in Dec  -I'll see her back in 3-4 months   All questions were answered. The patient knows to call the clinic with any problems, questions or concerns.  I spent 30 minutes counseling the patient face to face. The total time spent in the appointment was 35 minutes and more than 50% was on counseling.     Truitt Merle, MD 03/22/2016

## 2016-03-22 NOTE — Telephone Encounter (Signed)
AVS REPORT AND APPT SCHD GIVEN PER 03/22/16 LOS.

## 2016-05-21 ENCOUNTER — Telehealth: Payer: Self-pay

## 2016-05-21 NOTE — Telephone Encounter (Signed)
Patient called requesting to speak with Maria Larson regarding her Tamoxifen. Patient states she has been having a lot of breast tenderness on the breast she had surgery on in January and some joint pain. States she came off of letrozole at the end of August d/t severe joint pain and started tamoxifen Oct 1st. States she has a MM on November 22nd and sees Dr. Burr Medico at end of December and her Surgeon at the end of December. Informed Maria Larson of information, per Maria Larson the breast tenderness could be from the surgery and the tamoxifen, if this worsens or persists we can move her mammogram to an earlier date. Pt verbalized understanding and will call if it worsens.

## 2016-07-19 ENCOUNTER — Ambulatory Visit (HOSPITAL_BASED_OUTPATIENT_CLINIC_OR_DEPARTMENT_OTHER): Payer: 59

## 2016-07-19 ENCOUNTER — Other Ambulatory Visit (HOSPITAL_BASED_OUTPATIENT_CLINIC_OR_DEPARTMENT_OTHER): Payer: 59

## 2016-07-19 ENCOUNTER — Telehealth: Payer: Self-pay

## 2016-07-19 ENCOUNTER — Telehealth: Payer: Self-pay | Admitting: Oncology

## 2016-07-19 ENCOUNTER — Ambulatory Visit (HOSPITAL_BASED_OUTPATIENT_CLINIC_OR_DEPARTMENT_OTHER): Payer: 59 | Admitting: Oncology

## 2016-07-19 ENCOUNTER — Encounter: Payer: Self-pay | Admitting: Oncology

## 2016-07-19 VITALS — BP 168/95 | HR 105 | Temp 99.2°F | Resp 16 | Ht 66.0 in | Wt 166.5 lb

## 2016-07-19 DIAGNOSIS — Z79811 Long term (current) use of aromatase inhibitors: Secondary | ICD-10-CM | POA: Diagnosis not present

## 2016-07-19 DIAGNOSIS — Z170 Estrogen receptor positive status [ER+]: Secondary | ICD-10-CM

## 2016-07-19 DIAGNOSIS — M8580 Other specified disorders of bone density and structure, unspecified site: Secondary | ICD-10-CM | POA: Diagnosis not present

## 2016-07-19 DIAGNOSIS — C50412 Malignant neoplasm of upper-outer quadrant of left female breast: Secondary | ICD-10-CM | POA: Diagnosis not present

## 2016-07-19 DIAGNOSIS — I10 Essential (primary) hypertension: Secondary | ICD-10-CM

## 2016-07-19 LAB — COMPREHENSIVE METABOLIC PANEL
ALK PHOS: 44 U/L (ref 40–150)
ALT: 20 U/L (ref 0–55)
ANION GAP: 11 meq/L (ref 3–11)
AST: 18 U/L (ref 5–34)
Albumin: 3.6 g/dL (ref 3.5–5.0)
BILIRUBIN TOTAL: 0.77 mg/dL (ref 0.20–1.20)
BUN: 13.2 mg/dL (ref 7.0–26.0)
CALCIUM: 9 mg/dL (ref 8.4–10.4)
CO2: 25 meq/L (ref 22–29)
Chloride: 106 mEq/L (ref 98–109)
Creatinine: 0.8 mg/dL (ref 0.6–1.1)
EGFR: 75 mL/min/{1.73_m2} — AB (ref >90)
Glucose: 97 mg/dl (ref 70–140)
Potassium: 4.1 mEq/L (ref 3.5–5.1)
Sodium: 141 mEq/L (ref 136–145)
TOTAL PROTEIN: 6.8 g/dL (ref 6.4–8.3)

## 2016-07-19 LAB — CBC WITH DIFFERENTIAL/PLATELET
BASO%: 0.3 % (ref 0.0–2.0)
Basophils Absolute: 0 10*3/uL (ref 0.0–0.1)
EOS ABS: 0 10*3/uL (ref 0.0–0.5)
EOS%: 0.2 % (ref 0.0–7.0)
HEMATOCRIT: 43.4 % (ref 34.8–46.6)
HGB: 14.5 g/dL (ref 11.6–15.9)
LYMPH%: 24.4 % (ref 14.0–49.7)
MCH: 29.9 pg (ref 25.1–34.0)
MCHC: 33.4 g/dL (ref 31.5–36.0)
MCV: 89.5 fL (ref 79.5–101.0)
MONO#: 0.5 10*3/uL (ref 0.1–0.9)
MONO%: 7.3 % (ref 0.0–14.0)
NEUT%: 67.8 % (ref 38.4–76.8)
NEUTROS ABS: 4.3 10*3/uL (ref 1.5–6.5)
PLATELETS: 265 10*3/uL (ref 145–400)
RBC: 4.85 10*6/uL (ref 3.70–5.45)
RDW: 13.1 % (ref 11.2–14.5)
WBC: 6.3 10*3/uL (ref 3.9–10.3)
lymph#: 1.5 10*3/uL (ref 0.9–3.3)

## 2016-07-19 MED ORDER — DENOSUMAB 60 MG/ML ~~LOC~~ SOLN
60.0000 mg | Freq: Once | SUBCUTANEOUS | Status: AC
  Administered 2016-07-19: 60 mg via SUBCUTANEOUS
  Filled 2016-07-19: qty 1

## 2016-07-19 NOTE — Telephone Encounter (Signed)
Appointments scheduled per 12/28 LOS. Patient given AVS report and calendars with future scheduled appointments. °

## 2016-07-19 NOTE — Telephone Encounter (Signed)
Called [facility] for copy of mammogram done nov 2017. Spoke with Anderson Malta who will fax it over.  Fax rcv'd, copy placed to be scanned in chart.

## 2016-07-19 NOTE — Progress Notes (Signed)
[place]  [phone] [phone]  Clinic follow Up Note   Patient Care Team: Leighton Ruff, MD as PCP - General (Family Medicine) Excell Seltzer, MD as Consulting Physician (General Surgery) Truitt Merle, MD as Consulting Physician (Hematology) Arloa Koh, MD as Consulting Physician (Radiation Oncology) Sylvan Cheese, NP as Nurse Practitioner (Hematology and Oncology) Gery Pray, MD as Consulting Physician (Radiation Oncology) 07/19/2016  CHIEF COMPLAINTS:  Follow up left breast cancer   Oncology History   Breast cancer of upper-outer quadrant of left female breast [facility])   Staging form: Breast, AJCC 7th Edition     Clinical: Stage IIA (T2, N0, cM0(i+)) - Unsigned       Breast cancer of upper-outer quadrant of left female breast ([place])   06/21/2015 Mammogram    calcification on Mammogram, and a 3cm mass at 3:00 position of left breast on [place], axilla was negative on [place]       06/21/2015 Initial Biopsy    Left breast core needle biopsy showed invasive ductal carcinoma, grade 2, and DCIS      06/21/2015 Receptors her2    ER 100% positive, PR 100% positive, HER-2 negative, Ki-67 2%      06/21/2015 Clinical Stage    Stage IIA (T2 N0)      07/04/2015 Procedure    Breast/Ovarian panel: No clinically significant variants at ATM, BARD1, BRCA1, BRCA2, BRIP1, CDH1, CHEK2, EPCAM, FANCC, MLH1, MSH2, MSH6, NBN, PALB2, PMS2, PTEN, RAD51C, RAD51D, TP53, and XRCC2      07/27/2015 Surgery    left lumpectomy and SLN biopsy      07/27/2015 Pathology Results    left breast lumpectomy showed G1 IDC, 1.8cm,  (+) DCIS, margins negative. 0/1 LN. Repeat HER2/neu negative (ratio 1.54)      07/27/2015 Oncotype testing    RS 4, which predicts 5% 10-y distance recurence risk with Tamoxifen       07/27/2015 Pathologic Stage    Stage IA: pT1c pN0      08/31/2015 - 09/28/2015 Radiation Therapy    breast adjuvant radiation: Left breast 42.72 cGy  16 fractions, lumpectomy cavity boost 10 gray      10/2015 -  Anti-estrogen oral therapy    Letrozole 2.5 mg daily      11/17/2015 Survivorship    Survivorship visit completed and copy given to patient.       HISTORY OF PRESENTING ILLNESS:  Maria Larson 65 y.o. female is here because of her recently diagnosed left breast cancer. She is accompanied by her husband to our multidisciplinary breast clinic today.  The cancer was discovered by screening mammogram. She had ultrasound of breast for follow-up a left breast cyst 6 months ago which was negative except a 2.4 cm cyst. Her mammogram on 06/13/2015 showed a a new area of calcification and ultrasound showed 2.2 cm irregular solid mass in the left breast at 3 clock position, which is new and suspicious for malignancy. She underwent ultrasound-guided core needle biopsy on 06/21/2015 which showed grade 2 invasive ductal carcinoma. ER/PR 100% positive, HER-2 negative.  She had no palpable breast mass before the biopsy. She tolerated the biopsy well, no complications. She feels well overall, denies any significant pain, dyspnea, GI or other symptoms. She is a current Pharmacist, hospital, still works full-time, she is active, bike 3-4 time a week for 20 months.   She had and it etopical pregnancy, was not able to conceive after infertility workup and treatment. She lives with her husband, they  have a adopted son who is 54.   CURRENT THERAPY:  Tamoxifen 20 mg daily started in October 2017. She was previously on letrozole 2.62m daily started in 10/2015 and stopped in August 2017 due to arthralgias.  INTERIM HISTORY: NTheoreturns for follow-up. She has been compliant with amoxicillin. joint stiffness and pain is resolved. She also has some pain at the left breast incision sites, with tenderness, no recent trauma. She does wear a bra prosthesis some time. She has good appetite, eating well, remains to be physically active. Reports vaginal dryness and has tried  over-the-counter products. States that her OB has recommended a low-dose Premarin cream approximately 1/2 g 2 times per week and she wants to discuss this with Dr. FBurr Medico   MEDICAL HISTORY:  Past Medical History:  Diagnosis Date  . [place]   . [place]  . [place] of [place] of [place] female breast [place] 06/[place]  . Family history of breast cancer   . History of hiatal hernia   . Hyperlipemia   . Hypertension   . Interstitial cystitis   . PONV (postoperative nausea and vomiting)   . Radiation 08/31/2015-09/28/2015   Left breast 42.72 cGy extending fractions, lumpectomy cavity boost 10 gray    SURGICAL HISTORY: Past Surgical History:  Procedure Laterality Date  . ABDOMINAL HYSTERECTOMY    . COLONOSCOPY W/ POLYPECTOMY    . ESOPHAGOGASTRODUODENOSCOPY ENDOSCOPY    . laproscopy     Multiple laparoscopy for infertility issue  . RADIOACTIVE SEED GUIDED MASTECTOMY WITH AXILLARY SENTINEL LYMPH NODE BIOPSY Left 07/27/2015   Procedure: RADIOACTIVE SEED GUIDED LEFT LUMPECTOMY  WITH AXILLARY SENTINEL LYMPH NODE BIOPSY;  Surgeon: BExcell Seltzer MD;  [place]  Service: General;  Laterality: Left;    GYN HISTORY  Menarchal: 16 LMP: hysterectomy at 50  Contraceptive: none  HRT: yes, 14 years, stopped in 05/2015 G1P0:   SOCIAL HISTORY: Social History   Social History  . Marital Status: Married    Spouse Name: N/A  . Number of Children: One adopted sone who is 210  . Years of Education: N/A   Occupational History  . Kindergarten teacher   Social History Main Topics  . Smoking status: Never Smoker   . Smokeless tobacco: Not on file  . Alcohol Use: Yes     Comment: social   . Drug Use: No  . Sexual Activity: Not on file   Other Topics Concern  . Not on file   Social History Narrative    FAMILY HISTORY: Family History  Problem Relation Age of Onset  . Lung cancer Father     . Breast cancer Paternal Aunt     dx over 778 . Breast cancer Paternal Aunt     dx over 739s . Breast cancer Paternal Aunt     dx over 769 . Kidney disease Maternal Grandfather   . Congestive Heart Failure Paternal Grandmother   . Colon cancer Paternal Aunt     dx in her 752s   ALLERGIES:  is allergic to sulfa antibiotics.  MEDICATIONS:  Current Outpatient Prescriptions  Medication Sig Dispense Refill  . aspirin 81 MG tablet Take 81 mg by mouth daily.    . Calcium Carb-Cholecalciferol (CALCIUM 600+D) 600-800 MG-UNIT TABS Take 1 tablet by mouth daily.    . Calcium Glycerophosphate (PRELIEF PO) Take 1 tablet by mouth 3 (three) times daily before meals.    .Marland Kitchen  famotidine (PEPCID) 20 MG tablet Take 20 mg by mouth 2 (two) times daily.    . hyaluronate sodium (RADIAPLEXRX) GEL Apply 1 application topically 2 (two) times daily. Reported on 12/21/2015    . Meth-Hyo-M Bl-Na Phos-Ph Sal (URIBEL PO) Take 1 tablet by mouth as needed. Reported on 10/20/2015    . Multiple Vitamin (MULTIVITAMIN) tablet Take 1 tablet by mouth daily.    . non-metallic deodorant Jethro Poling) MISC Apply 1 application topically daily as needed. Reported on 12/21/2015    . ramipril (ALTACE) 10 MG capsule Take 10 mg by mouth daily.    . rosuvastatin (CRESTOR) 10 MG tablet Take 10 mg by mouth daily.    . tamoxifen (NOLVADEX) 20 MG tablet Take 1 tablet (20 mg total) by mouth daily. 30 tablet 2  . Vaginal Lubricant (REPLENS) GEL Place vaginally.    Marland Kitchen letrozole (FEMARA) 2.5 MG tablet Take 1 tablet (2.5 mg total) by mouth daily. (Patient not taking: Reported on 07/19/2016) 90 tablet 1   No current facility-administered medications for this visit.     REVIEW OF SYSTEMS:   Constitutional: Denies fevers, chills or abnormal night sweats Eyes: Denies blurriness of vision, double vision or watery eyes Ears, nose, mouth, throat, and face: Denies mucositis or sore throat Respiratory: Denies cough, dyspnea or wheezes Cardiovascular:  Denies palpitation, chest discomfort or lower extremity swelling Gastrointestinal:  Denies nausea, heartburn or change in bowel habits Skin: Denies abnormal skin rashes Lymphatics: Denies new lymphadenopathy or easy bruising Neurological:Denies numbness, tingling or new weaknesses Behavioral/Psych: Mood is stable, no new changes  All other systems were reviewed with the patient and are negative.  PHYSICAL EXAMINATION: ECOG PERFORMANCE STATUS: 1  Vitals:   07/19/16 1318  BP: (!) 168/95  Pulse: (!) 105  Resp: 16  Temp: 99.2 F (37.3 C)   Filed Weights   07/19/16 1318  Weight: 166 lb 8 oz (75.5 kg)   GENERAL:alert, no distress and comfortable SKIN: skin color, texture, turgor are normal, no rashes or significant lesions EYES: normal, conjunctiva are pink and non-injected, sclera clear OROPHARYNX:no exudate, no erythema and lips, buccal mucosa, and tongue normal  NECK: supple, thyroid normal size, non-tender, without nodularity LYMPH:  no palpable lymphadenopathy in the cervical, axillary or inguinal LUNGS: clear to auscultation and percussion with normal breathing effort HEART: regular rate & rhythm and no murmurs and no lower extremity edema ABDOMEN:abdomen soft, non-tender and normal bowel sounds Musculoskeletal:no cyanosis of digits and no clubbing  PSYCH: alert & oriented x 3 with fluent speech NEURO: no focal motor/sensory deficits Breasts: Breast inspection showed them to be symmetrical with no nipple discharge. The surgical incisions in the left axilla and around the left nipple has healed well, mild skin erythema and her mentation in the left breast. Palpation of the breasts and axilla revealed no obvious mass that I could appreciate.   LABORATORY DATA:  I have reviewed the data as listed CBC Latest Ref Rng & Units 07/19/2016 03/22/2016 12/21/2015  WBC 3.9 - 10.3 10e3/uL 6.3 6.2 4.6  Hemoglobin 11.6 - 15.9 g/dL 14.5 13.6 14.3  Hematocrit 34.8 - 46.6 % 43.4 40.3 42.9   Platelets 145 - 400 10e3/uL 265 275 256    CMP Latest Ref Rng & Units 07/19/2016 03/22/2016 12/21/2015  Glucose 70 - 140 mg/dl 97 99 75  BUN 7.0 - 26.0 mg/dL 13.2 17.4 17.4  Creatinine 0.6 - 1.1 mg/dL 0.8 0.8 0.9  Sodium 136 - 145 mEq/L 141 142 142  Potassium 3.5 - 5.1  mEq/L 4.1 3.9 3.8  Chloride 101 - 111 mmol/L - - -  CO2 22 - 29 mEq/L _0 Calcium 8.4 - 10.4 mg/dL 9.0 9.2 9.2  Total Protein 6.4 - 8.3 g/dL 6.8 7.0 6.6  Total Bilirubin 0.20 - 1.20 mg/dL 0.77 0.77 1.26(H)  Alkaline Phos 40 - 150 U/L 44 69 69  AST 5 - 34 U/L _1 ALT 0 - 55 U/L _2 PATHOLOGY REPORT: Diagnosis 07/27/2015 1. Breast, lumpectomy, Left - INVASIVE DUCTAL CARCINOMA, GRADE 1/3, SPANNING 1.8 CM. - DUCTAL CARCINOMA IN SITU, LOW GRADE. - LYMPHOVASCULAR INVASION IS IDENTIFIED. - INVASIVE CARCINOMA IS FOCALLY 0.1 CM TO THE ANTERIOR MARGIN OF SPECIMEN #1. - SEE ONCOLOGY TABLE BELOW. 2. Breast, excision, Additional inferior margin, left - FIBROCYSTIC CHANGES WITH USUAL DUCTAL HYPERPLASIA. - THERE IS NO EVIDENCE OF MALIGNANCY. - SEE COMMENT. 3. Breast, excision, Additional posterior margin, left - FIBROCYSTIC CHANGES WITH USUAL DUCTAL HYPERPLASIA. - THERE IS NO EVIDENCE OF MALIGNANCY. - SEE COMMENT. 4. Lymph node, sentinel, biopsy, Left axillary - THERE IS NO EVIDENCE OF CARCINOMA IN 1 OF 1 LYMPH NODE (0/1). 5. Fatty tissue, Left axillary tissue - BENIGN FIBROADIPOSE TISSUE. - LYMPH NODAL TISSUE IS NOT IDENTIFIED. - THERE IS NO EVIDENCE OF MALIGNANCY.  Microscopic Comment 1. BREAST, INVASIVE TUMOR, WITH LYMPH NODES PRESENT Specimen, including laterality and lymph node sampling (sentinel, non-sentinel): Left breast and left axillary lymph node. Procedure: Seed localized lumpectomy, additional margin resections, and lymph node resection x1. Histologic type: Ductal. Grade: 1. Tubule formation: 3. Nuclear pleomorphism: 1. Mitotic: 1. Tumor size (gross measurement): 1.8  cm. Margins: Invasive, distance to closest margin: Focally 0.1 cm to the anterior margin. In-situ, distance to closest margin: Greater than 0.2 cm to all margins. Lymphovascular invasion: Present. Ductal carcinoma in situ: Present. Grade: Low grade. Extensive intraductal component: Not identified. Lobular neoplasia: Not identified. Tumor focality: Unifocal. Treatment effect: N/A. Extent of tumor: Confined to breast parenchyma. Lymph nodes: Examined: 1 Sentinel 0 Non-sentinel 1 Total Lymph nodes with metastasis: 0. Breast prognostic profile: [id number]. Estrogen receptor: 100%, strong staining intensity. Progesterone receptor: 100%, strong staining intensity. Her 2 neu: No amplification was determined. Her 2 neu by FISH will be repeated on the current case and the results reported separately. Ki-67: 2%. Non-neoplastic breast: Fibrocystic changes with calcifications, usual ductal hyperplasia and healing biopsy site. TNM: pT1c, pN0. 2. , and 3. The surgical resection margin(s) of the specimen were inked and microscopically evaluated. (JBK:ds 07/28/15)  Oncotype RS 4, which predicts 5% 10-y distance recurrence risk with Tamoxifen   RADIOGRAPHIC STUDIES: I have personally reviewed the radiological images as listed and agreed with the findings in the report.  DEXA 09/12/2015  iMPRESSION:   Based on the WHO criteria this patient has osteopenia  Left femur neck T score -2.3, -13% change since February 2009   10 year probability of major  Osteoporotic fracture is 27% and hip fracture is 1.5%  ASSESSMENT & PLAN:  65 year old Caucasian postmenopausal female, with past medical history of hypertension,dyslipidemia, presented with screening discovered left breast cancer.  1. Breast cancer of upper outer quadrant of left breast, pT1cN0M0, stage Ia,  ER and PR 100% positive, HER-2 negative, Ki67 2%, (+) DCIS, low RS 4 --We discussed her surgical pathology findings in great details with  patient and her husband. -I reviewed her Oncotype DX test results, which showed a low risk recurrence score (4), predicts 5% 10 year distance recurrence risk with tamoxifen  alone. Given the low risk of recurrence, adjuvant chemotherapy was not recommended. -She is on adjuvant tamoxifen and tolerating it well. Into you tamoxifen. She is up-to-date on her Pap smear. - Mammogram in November 2017 at [facility] was normal. Recommended follow-up in one year. -We'll continue breast cancer surveillance plan, Including annual mammogram, physical exam including breast exam every 6-12 months, and routine follow-up and lab test.   2. Arthralgia -She has developed worsening moderate arthralgia, I encouraged her to exercise regularly, and take NSAIDs as needed. She can also try acupuncture if gets worse. -Arthralgias resolved after stopping letrozole.  3. Hot flush -Started when she stopped hormonal replacement in November 2016. It has improved over time. Tolerable now  4. Left breast pain -Possible related to her bra prosthesis, and or possible letrozole  -I recommend her to use sports bra, avoid using prosthesis for now  -She knows to use NSAIDs as needed  5. Osteopenia  -Her recent bone density scan in [place] showed osteopenia with a high risk for fracture -We discussed that letrozole will probably worsen her bone density, and we'll monitor her bone density every 2 years  -I encouraged her to continue calcium and vitamin D, she is very compliant  -I encouraged her to continue weightbearing exercise -Continue Prolia. Will continue every 6 months  6. Genetic -she was seen by genetic counselor in our cancer center, and her genetic testing for inheritable breast and ovarian syndrome panel was negative.  7 Hypertension and dyslipidemia -She will continue follow-up with her permit care physician  Plan -Continue tamoxifen  -continue prolia every 6 months, next due in June -Will see her back in 4 months in  order to alternate appointments with surgery. -We will discuss the use of low-dose Premarin cream as recommended by her OB/GYN with Dr. Burr Medico upon her return.   All questions were answered. The patient knows to call the clinic with any problems, questions or concerns.  I spent 20 minutes counseling the patient face to face. The total time spent in the appointment was 25 minutes and more than 50% was on counseling.     Mikey Bussing, NP 07/19/2016

## 2016-07-19 NOTE — Patient Instructions (Signed)
Denosumab injection  What is this medicine?  DENOSUMAB (den oh sue mab) slows bone breakdown. Prolia is used to treat osteoporosis in women after menopause and in men. Xgeva is used to prevent bone fractures and other bone problems caused by cancer bone metastases. Xgeva is also used to treat giant cell tumor of the bone.  This medicine may be used for other purposes; ask your health care provider or pharmacist if you have questions.  What should I tell my health care provider before I take this medicine?  They need to know if you have any of these conditions:  -dental disease  -eczema  -infection or history of infections  -kidney disease or on dialysis  -low blood calcium or vitamin D  -malabsorption syndrome  -scheduled to have surgery or tooth extraction  -taking medicine that contains denosumab  -thyroid or parathyroid disease  -an unusual reaction to denosumab, other medicines, foods, dyes, or preservatives  -pregnant or trying to get pregnant  -breast-feeding  How should I use this medicine?  This medicine is for injection under the skin. It is given by a health care professional in a hospital or clinic setting.  If you are getting Prolia, a special MedGuide will be given to you by the pharmacist with each prescription and refill. Be sure to read this information carefully each time.  For Prolia, talk to your pediatrician regarding the use of this medicine in children. Special care may be needed. For Xgeva, talk to your pediatrician regarding the use of this medicine in children. While this drug may be prescribed for children as young as 13 years for selected conditions, precautions do apply.  Overdosage: If you think you have taken too much of this medicine contact a poison control center or emergency room at once.  NOTE: This medicine is only for you. Do not share this medicine with others.  What if I miss a dose?  It is important not to miss your dose. Call your doctor or health care professional if you are  unable to keep an appointment.  What may interact with this medicine?  Do not take this medicine with any of the following medications:  -other medicines containing denosumab  This medicine may also interact with the following medications:  -medicines that suppress the immune system  -medicines that treat cancer  -steroid medicines like prednisone or cortisone  This list may not describe all possible interactions. Give your health care provider a list of all the medicines, herbs, non-prescription drugs, or dietary supplements you use. Also tell them if you smoke, drink alcohol, or use illegal drugs. Some items may interact with your medicine.  What should I watch for while using this medicine?  Visit your doctor or health care professional for regular checks on your progress. Your doctor or health care professional may order blood tests and other tests to see how you are doing.  Call your doctor or health care professional if you get a cold or other infection while receiving this medicine. Do not treat yourself. This medicine may decrease your body's ability to fight infection.  You should make sure you get enough calcium and vitamin D while you are taking this medicine, unless your doctor tells you not to. Discuss the foods you eat and the vitamins you take with your health care professional.  See your dentist regularly. Brush and floss your teeth as directed. Before you have any dental work done, tell your dentist you are receiving this medicine.  Do   not become pregnant while taking this medicine or for 5 months after stopping it. Women should inform their doctor if they wish to become pregnant or think they might be pregnant. There is a potential for serious side effects to an unborn child. Talk to your health care professional or pharmacist for more information.  What side effects may I notice from receiving this medicine?  Side effects that you should report to your doctor or health care professional as soon as  possible:  -allergic reactions like skin rash, itching or hives, swelling of the face, lips, or tongue  -breathing problems  -chest pain  -fast, irregular heartbeat  -feeling faint or lightheaded, falls  -fever, chills, or any other sign of infection  -muscle spasms, tightening, or twitches  -numbness or tingling  -skin blisters or bumps, or is dry, peels, or red  -slow healing or unexplained pain in the mouth or jaw  -unusual bleeding or bruising  Side effects that usually do not require medical attention (Report these to your doctor or health care professional if they continue or are bothersome.):  -muscle pain  -stomach upset, gas  This list may not describe all possible side effects. Call your doctor for medical advice about side effects. You may report side effects to FDA at 1-800-FDA-1088.  Where should I keep my medicine?  This medicine is only given in a clinic, doctor's office, or other health care setting and will not be stored at home.  NOTE: This sheet is a summary. It may not cover all possible information. If you have questions about this medicine, talk to your doctor, pharmacist, or health care provider.      2016, Elsevier/Gold Standard. (2012-01-07 12:37:47)

## 2016-07-24 ENCOUNTER — Telehealth: Payer: Self-pay | Admitting: *Deleted

## 2016-07-24 NOTE — Telephone Encounter (Signed)
Called pt & informed OK to use low dose premarin cream.  She has a brand new tube at home but not sure if expired & will call OB/GYN if refill needed.

## 2016-07-24 NOTE — Telephone Encounter (Signed)
-----   Message from Truitt Merle, MD sent at 07/22/2016  6:19 PM EST ----- Regarding: RE: Question from pt Maria Larson,  Thanks for seeing her. Since she is on Tamoxifen, an estrogen receptor blocking agent, it's OK to use low dose premarin cream if needed. Please let her know.  Thanks  Krista Blue  ----- Message ----- From: Maryanna Shape, NP Sent: 07/20/2016   4:31 PM To: Jesse Fall, RN, Thu Simon Rhein, RN, # Subject: Question from pt                               Dr Burr Medico  I saw [name] for a routine f/u on 12/28. She reported vaginal dryness and has tried multiple lubricants without improvement. Her OB/GYN recommended a low dose Premarin cream (1/2 gram) to be applied 2X/week, but the patient was told to check with you to see if ok.  I told her I would send you this message for f/u. I told her to call back the middle of next week if she does nto hear back from [place] by then.  Thanks [employer]

## 2016-07-30 ENCOUNTER — Other Ambulatory Visit: Payer: Self-pay | Admitting: Hematology

## 2016-07-30 DIAGNOSIS — C50412 Malignant neoplasm of upper-outer quadrant of left female breast: Secondary | ICD-10-CM

## 2016-07-30 DIAGNOSIS — Z170 Estrogen receptor positive status [ER+]: Principal | ICD-10-CM

## 2016-08-13 ENCOUNTER — Other Ambulatory Visit: Payer: Self-pay | Admitting: Obstetrics & Gynecology

## 2016-11-16 ENCOUNTER — Other Ambulatory Visit (HOSPITAL_BASED_OUTPATIENT_CLINIC_OR_DEPARTMENT_OTHER): Payer: 59

## 2016-11-16 ENCOUNTER — Ambulatory Visit (HOSPITAL_BASED_OUTPATIENT_CLINIC_OR_DEPARTMENT_OTHER): Payer: 59 | Admitting: Hematology

## 2016-11-16 ENCOUNTER — Other Ambulatory Visit: Payer: 59

## 2016-11-16 ENCOUNTER — Ambulatory Visit: Payer: 59 | Admitting: Hematology

## 2016-11-16 ENCOUNTER — Telehealth: Payer: Self-pay | Admitting: Hematology

## 2016-11-16 VITALS — BP 163/95 | HR 79 | Temp 98.3°F | Resp 18 | Ht 66.0 in | Wt 173.3 lb

## 2016-11-16 DIAGNOSIS — Z79810 Long term (current) use of selective estrogen receptor modulators (SERMs): Secondary | ICD-10-CM

## 2016-11-16 DIAGNOSIS — C50412 Malignant neoplasm of upper-outer quadrant of left female breast: Secondary | ICD-10-CM

## 2016-11-16 DIAGNOSIS — Z170 Estrogen receptor positive status [ER+]: Secondary | ICD-10-CM | POA: Diagnosis not present

## 2016-11-16 DIAGNOSIS — M8580 Other specified disorders of bone density and structure, unspecified site: Secondary | ICD-10-CM | POA: Diagnosis not present

## 2016-11-16 DIAGNOSIS — R600 Localized edema: Secondary | ICD-10-CM

## 2016-11-16 DIAGNOSIS — I10 Essential (primary) hypertension: Secondary | ICD-10-CM | POA: Diagnosis not present

## 2016-11-16 LAB — COMPREHENSIVE METABOLIC PANEL
ALT: 20 U/L (ref 0–55)
ANION GAP: 7 meq/L (ref 3–11)
AST: 19 U/L (ref 5–34)
Albumin: 3.5 g/dL (ref 3.5–5.0)
Alkaline Phosphatase: 38 U/L — ABNORMAL LOW (ref 40–150)
BILIRUBIN TOTAL: 0.75 mg/dL (ref 0.20–1.20)
BUN: 20.7 mg/dL (ref 7.0–26.0)
CHLORIDE: 107 meq/L (ref 98–109)
CO2: 27 meq/L (ref 22–29)
CREATININE: 1.4 mg/dL — AB (ref 0.6–1.1)
Calcium: 8.5 mg/dL (ref 8.4–10.4)
EGFR: 41 mL/min/{1.73_m2} — ABNORMAL LOW (ref >90)
GLUCOSE: 99 mg/dL (ref 70–140)
Potassium: 3.8 mEq/L (ref 3.5–5.1)
Sodium: 141 mEq/L (ref 136–145)
TOTAL PROTEIN: 6.2 g/dL — AB (ref 6.4–8.3)

## 2016-11-16 LAB — CBC WITH DIFFERENTIAL/PLATELET
BASO%: 0.4 % (ref 0.0–2.0)
Basophils Absolute: 0 10*3/uL (ref 0.0–0.1)
EOS%: 0.9 % (ref 0.0–7.0)
Eosinophils Absolute: 0.1 10*3/uL (ref 0.0–0.5)
HCT: 39.9 % (ref 34.8–46.6)
HGB: 13.2 g/dL (ref 11.6–15.9)
LYMPH#: 1.7 10*3/uL (ref 0.9–3.3)
LYMPH%: 31.3 % (ref 14.0–49.7)
MCH: 30 pg (ref 25.1–34.0)
MCHC: 33.1 g/dL (ref 31.5–36.0)
MCV: 90.7 fL (ref 79.5–101.0)
MONO#: 0.5 10*3/uL (ref 0.1–0.9)
MONO%: 9.1 % (ref 0.0–14.0)
NEUT%: 58.3 % (ref 38.4–76.8)
NEUTROS ABS: 3.1 10*3/uL (ref 1.5–6.5)
PLATELETS: 221 10*3/uL (ref 145–400)
RBC: 4.4 10*6/uL (ref 3.70–5.45)
RDW: 13 % (ref 11.2–14.5)
WBC: 5.4 10*3/uL (ref 3.9–10.3)

## 2016-11-16 NOTE — Telephone Encounter (Signed)
Gave patient AVS and calender per 4/27 los - Mammo already scheduled per Shelton Silvas at [facility]

## 2016-11-16 NOTE — Progress Notes (Signed)
[place]  [phone]) [phone] [phone]) [phone]  Clinic follow Up Note   Patient Care Team: Leighton Ruff, MD as PCP - General (Family Medicine) Excell Seltzer, MD as Consulting Physician (General Surgery) Truitt Merle, MD as Consulting Physician (Hematology) Arloa Koh, MD as Consulting Physician (Radiation Oncology) Sylvan Cheese, NP as Nurse Practitioner (Hematology and Oncology) Gery Pray, MD as Consulting Physician (Radiation Oncology) 11/16/2016  CHIEF COMPLAINTS:  Follow up left breast cancer   Oncology History   Breast cancer of upper-outer quadrant of left female breast [facility])   Staging form: Breast, AJCC 7th Edition     Clinical: Stage IIA (T2, N0, cM0(i+)) - Unsigned       Breast cancer of upper-outer quadrant of left female breast ([place])   06/21/2015 Mammogram    calcification on Mammogram, and a 3cm mass at 3:00 position of left breast on [place], axilla was negative on [place]       06/21/2015 Initial Biopsy    Left breast core needle biopsy showed invasive ductal carcinoma, grade 2, and DCIS      06/21/2015 Receptors her2    ER 100% positive, PR 100% positive, HER-2 negative, Ki-67 2%      06/21/2015 Clinical Stage    Stage IIA (T2 N0)      07/04/2015 Procedure    Breast/Ovarian panel: No clinically significant variants at ATM, BARD1, BRCA1, BRCA2, BRIP1, CDH1, CHEK2, EPCAM, FANCC, MLH1, MSH2, MSH6, NBN, PALB2, PMS2, PTEN, RAD51C, RAD51D, TP53, and XRCC2      07/27/2015 Surgery    left lumpectomy and SLN biopsy      07/27/2015 Pathology Results    left breast lumpectomy showed G1 IDC, 1.8cm,  (+) DCIS, margins negative. 0/1 LN. Repeat HER2/neu negative (ratio 1.54)      07/27/2015 Oncotype testing    RS 4, which predicts 5% 10-y distance recurence risk with Tamoxifen       07/27/2015 Pathologic Stage    Stage IA: pT1c pN0      08/31/2015 - 09/28/2015 Radiation Therapy    breast adjuvant radiation: Left breast 42.72 cGy  16 fractions, lumpectomy cavity boost 10 gray      09/12/2015 Imaging    DEXA 09/12/2015  IMPRESSION:   Based on the WHO criteria this patient has osteopenia  Left femur neck T score -2.3, -13% change since February 2009   10 year probability of major  Osteoporotic fracture is 27% and hip fracture is 1.5%      10/2015 - 02/2016 Anti-estrogen oral therapy    Letrozole 2.5 mg daily. Stopped due to arthralgias      11/17/2015 Survivorship    Survivorship visit completed and copy given to patient.      04/2016 -  Anti-estrogen oral therapy    Tamoxifen 20 mg daily started in October 2017.       HISTORY OF PRESENTING ILLNESS:  Maria Larson 66 y.o. female is here because of her recently diagnosed left breast cancer. She is accompanied by her husband to our multidisciplinary breast clinic today.  The cancer was discovered by screening mammogram. She had ultrasound of breast for follow-up a left breast cyst 6 months ago which was negative except a 2.4 cm cyst. Her mammogram on 06/13/2015 showed a a new area of calcification and ultrasound showed 2.2 cm irregular solid mass in the left breast at 3 clock position, which is new and suspicious for malignancy. She underwent ultrasound-guided core needle biopsy on 06/21/2015 which showed grade 2 invasive  ductal carcinoma. ER/PR 100% positive, HER-2 negative.  She had no palpable breast mass before the biopsy. She tolerated the biopsy well, no complications. She feels well overall, denies any significant pain, dyspnea, GI or other symptoms. She is a current Pharmacist, hospital, still works full-time, she is active, bike 3-4 time a week for 20 months.   She had and it etopical pregnancy, was not able to conceive after infertility workup and treatment. She lives with her husband, they have a adopted son who is 4.   CURRENT THERAPY: Tamoxifen 20 mg daily started in October 2017. She was previously on letrozole 2.51m daily started in 10/2015 and stopped in August  2017 due to arthralgias.  INTERIM HISTORY: [place]. She is on Tamoxifen. She complains of hot flashes but tolerable. She has some joint pain and is not sure if it is age related or from the Tamoxifen. She is a tPharmacist, hospitaland stands all day. [place] She would like to keep her medical oncology appointments here until she becomes accustomed to the area. She reports a pulling sensation when she raises/stretch her left arm. Reports occasional burning sensation in her left breast. The patient reports right lower extremity swelling without pain for the past week. The patient takes baby aspirin daily.  MEDICAL HISTORY:  Past Medical History:  Diagnosis Date  . [place]   . [place] 06[place]  . Breast [place] of [place] female breast [place] 06/24/2015  . Family history of breast cancer   . History of hiatal hernia   . Hyperlipemia   . Hypertension   . Interstitial cystitis   . PONV (postoperative nausea and vomiting)   . Radiation 08/31/2015-09/28/2015   Left breast 42.72 cGy extending fractions, lumpectomy cavity boost 10 gray    SURGICAL HISTORY: Past Surgical History:  Procedure Laterality Date  . ABDOMINAL HYSTERECTOMY    . COLONOSCOPY W/ POLYPECTOMY    . ESOPHAGOGASTRODUODENOSCOPY ENDOSCOPY    . laproscopy     Multiple laparoscopy for infertility issue  . RADIOACTIVE SEED GUIDED MASTECTOMY WITH AXILLARY SENTINEL LYMPH NODE BIOPSY Left 07/27/2015   Procedure: RADIOACTIVE SEED GUIDED LEFT LUMPECTOMY  WITH AXILLARY SENTINEL LYMPH NODE BIOPSY;  Surgeon: BExcell Seltzer MD;  [place]  Service: General;  Laterality: Left;    GYN HISTORY  Menarchal: 16 LMP: hysterectomy at 50  Contraceptive: none  HRT: yes, 14 years, stopped in 05/2015 G1P0:   SOCIAL HISTORY: Social History   Social History  . Marital Status: Married      Spouse Name: N/A  . Number of Children: One adopted sone who is 271  . Years of Education: N/A   Occupational History  . Kindergarten teacher   Social History Main Topics  . Smoking status: Never Smoker   . Smokeless tobacco: Not on file  . Alcohol Use: Yes     Comment: social   . Drug Use: No  . Sexual Activity: Not on file   Other Topics Concern  . Not on file   Social History Narrative    FAMILY HISTORY: Family History  Problem Relation Age of Onset  . Lung cancer Father   . Breast cancer Paternal Aunt     dx over 737 . Breast cancer Paternal Aunt     dx over 734s . Breast cancer Paternal Aunt     dx  over 70  . Kidney disease Maternal Grandfather   . Congestive Heart Failure Paternal Grandmother   . Colon cancer Paternal Aunt     dx in her 12s    ALLERGIES:  is allergic to sulfa antibiotics.  MEDICATIONS:  Current Outpatient Prescriptions  Medication Sig Dispense Refill  . aspirin 81 MG tablet Take 81 mg by mouth daily.    . Calcium Carb-Cholecalciferol (CALCIUM 600+D) 600-800 MG-UNIT TABS Take 1 tablet by mouth daily.    . Calcium Glycerophosphate (PRELIEF PO) Take 1 tablet by mouth 3 (three) times daily before meals.    . conjugated estrogens (PREMARIN) vaginal cream Place 1 Applicatorful vaginally 2 (two) times a week. Uses 1/2 gram twice weekly    . famotidine (PEPCID) 20 MG tablet Take 20 mg by mouth 2 (two) times daily.    . Meth-Hyo-M Bl-Na Phos-Ph Sal (URIBEL PO) Take 1 tablet by mouth as needed. Reported on 10/20/2015    . Multiple Vitamin (MULTIVITAMIN) tablet Take 1 tablet by mouth daily.    . ramipril (ALTACE) 10 MG capsule Take 10 mg by mouth daily.    . rosuvastatin (CRESTOR) 10 MG tablet Take 10 mg by mouth daily.    . tamoxifen (NOLVADEX) 20 MG tablet TAKE 1 TABLET BY MOUTH DAILY 90 tablet 1  . hyaluronate sodium (RADIAPLEXRX) GEL Apply 1 application topically 2 (two) times daily. Reported on 9/75/8832    . non-metallic deodorant Jethro Poling) MISC  Apply 1 application topically daily as needed. Reported on 12/21/2015     No current facility-administered medications for this visit.     REVIEW OF SYSTEMS:   Constitutional: Denies fevers, chills or abnormal night sweats (+) Hot flash Eyes: Denies blurriness of vision, double vision or watery eyes Ears, nose, mouth, throat, and face: Denies mucositis or sore throat Respiratory: Denies cough, dyspnea or wheezes Cardiovascular: Denies palpitation, chest discomfort (+) Right foot swelling Gastrointestinal:  Denies nausea, heartburn or change in bowel habits Skin: Denies abnormal skin rashes Lymphatics: Denies new lymphadenopathy or easy bruising Neurological:Denies numbness, tingling or new weaknesses (+) Occasional burning sensation of her left breast. MSK: (+) Occasional discomfort when stretching her left arm. Behavioral/Psych: Mood is stable, no new changes  All other systems were reviewed with the patient and are negative.  PHYSICAL EXAMINATION: ECOG PERFORMANCE STATUS: 1  Vitals:   11/16/16 1503  BP: (!) 163/95  Pulse: 79  Resp: 18  Temp: 98.3 F (36.8 C)   Filed Weights   11/16/16 1503  Weight: 173 lb 4.8 oz (78.6 kg)   GENERAL:alert, no distress and comfortable SKIN: skin color, texture, turgor are normal, no rashes or significant lesions EYES: normal, conjunctiva are pink and non-injected, sclera clear OROPHARYNX:no exudate, no erythema and lips, buccal mucosa, and tongue normal  NECK: supple, thyroid normal size, non-tender, without nodularity LYMPH:  no palpable lymphadenopathy in the cervical, axillary or inguinal LUNGS: clear to auscultation and percussion with normal breathing effort HEART: regular rate & rhythm and no murmurs (+) Mild edema of the right lower extremity up to the knee. No calf tenderness. ABDOMEN:abdomen soft, non-tender and normal bowel sounds Musculoskeletal:no cyanosis of digits and no clubbing  PSYCH: alert & oriented x 3 with fluent  speech NEURO: no focal motor/sensory deficits Breasts: Breast inspection showed them to be symmetrical with no nipple discharge. The surgical incisions in the left axilla and around the left nipple has healed well, mild skin erythema and her mentation in the left breast. Palpation of the breasts and axilla  revealed no obvious mass that I could appreciate.   LABORATORY DATA:  I have reviewed the data as listed CBC Latest Ref Rng & Units 11/16/2016 07/19/2016 03/22/2016  WBC 3.9 - 10.3 10e3/uL 5.4 6.3 6.2  Hemoglobin 11.6 - 15.9 g/dL 13.2 14.5 13.6  Hematocrit 34.8 - 46.6 % 39.9 43.4 40.3  Platelets 145 - 400 10e3/uL 221 265 275    CMP Latest Ref Rng & Units 11/16/2016 07/19/2016 03/22/2016  Glucose 70 - 140 mg/dl 99 97 99  BUN 7.0 - 26.0 mg/dL 20.7 13.2 17.4  Creatinine 0.6 - 1.1 mg/dL 1.4(H) 0.8 0.8  Sodium 136 - 145 mEq/L 141 141 142  Potassium 3.5 - 5.1 mEq/L 3.8 4.1 3.9  Chloride 101 - 111 mmol/L - - -  CO2 22 - 29 mEq/L '27 25 27  ' Calcium 8.4 - 10.4 mg/dL 8.5 9.0 9.2  Total Protein 6.4 - 8.3 g/dL 6.2(L) 6.8 7.0  Total Bilirubin 0.20 - 1.20 mg/dL 0.75 0.77 0.77  Alkaline Phos 40 - 150 U/L 38(L) 44 69  AST 5 - 34 U/L '19 18 17  ' ALT 0 - 55 U/L '20 20 16     ' PATHOLOGY REPORT: Diagnosis 07/27/2015 1. Breast, lumpectomy, Left - INVASIVE DUCTAL CARCINOMA, GRADE 1/3, SPANNING 1.8 CM. - DUCTAL CARCINOMA IN SITU, LOW GRADE. - LYMPHOVASCULAR INVASION IS IDENTIFIED. - INVASIVE CARCINOMA IS FOCALLY 0.1 CM TO THE ANTERIOR MARGIN OF SPECIMEN #1. - SEE ONCOLOGY TABLE BELOW. 2. Breast, excision, Additional inferior margin, left - FIBROCYSTIC CHANGES WITH USUAL DUCTAL HYPERPLASIA. - THERE IS NO EVIDENCE OF MALIGNANCY. - SEE COMMENT. 3. Breast, excision, Additional posterior margin, left - FIBROCYSTIC CHANGES WITH USUAL DUCTAL HYPERPLASIA. - THERE IS NO EVIDENCE OF MALIGNANCY. - SEE COMMENT. 4. Lymph node, sentinel, biopsy, Left axillary - THERE IS NO EVIDENCE OF CARCINOMA IN 1 OF 1 LYMPH  NODE (0/1). 5. Fatty tissue, Left axillary tissue - BENIGN FIBROADIPOSE TISSUE. - LYMPH NODAL TISSUE IS NOT IDENTIFIED. - THERE IS NO EVIDENCE OF MALIGNANCY.  Microscopic Comment 1. BREAST, INVASIVE TUMOR, WITH LYMPH NODES PRESENT Specimen, including laterality and lymph node sampling (sentinel, non-sentinel): Left breast and left axillary lymph node. Procedure: Seed localized lumpectomy, additional margin resections, and lymph node resection x1. Histologic type: Ductal. Grade: 1. Tubule formation: 3. Nuclear pleomorphism: 1. Mitotic: 1. Tumor size (gross measurement): 1.8 cm. Margins: Invasive, distance to closest margin: Focally 0.1 cm to the anterior margin. In-situ, distance to closest margin: Greater than 0.2 cm to all margins. Lymphovascular invasion: Present. Ductal carcinoma in situ: Present. Grade: Low grade. Extensive intraductal component: Not identified. Lobular neoplasia: Not identified. Tumor focality: Unifocal. Treatment effect: N/A. Extent of tumor: Confined to breast parenchyma. Lymph nodes: Examined: 1 Sentinel 0 Non-sentinel 1 Total Lymph nodes with metastasis: 0. Breast prognostic profile: [id number]. Estrogen receptor: 100%, strong staining intensity. Progesterone receptor: 100%, strong staining intensity. Her 2 neu: No amplification was determined. Her 2 neu by FISH will be repeated on the current case and the results reported separately. Ki-67: 2%. Non-neoplastic breast: Fibrocystic changes with calcifications, usual ductal hyperplasia and healing biopsy site. TNM: pT1c, pN0. 2. , and 3. The surgical resection margin(s) of the specimen were inked and microscopically evaluated. (JBK:ds 07/28/15)  Oncotype RS 4, which predicts 5% 10-y distance recurrence risk with Tamoxifen   RADIOGRAPHIC STUDIES: I have personally reviewed the radiological images as listed and agreed with the findings in the report.  DEXA 09/12/2015  IMPRESSION:   Based on the  WHO criteria this patient has osteopenia  Left femur neck T score -2.3, -13% change since February 2009   10 year probability of major  Osteoporotic fracture is 27% and hip fracture is 1.5%  ASSESSMENT & PLAN:  66 y.o. Caucasian postmenopausal female, with past medical history of hypertension,dyslipidemia, presented with screening discovered left breast cancer.  1. Breast cancer of upper outer quadrant of left breast, pT1cN0M0, stage Ia,  ER and PR 100% positive, HER-2 negative, Ki67 2%, (+) DCIS, low RS 4 --We discussed her surgical pathology findings in great details with patient and her husband. -I reviewed her Oncotype DX test results, which showed a low risk recurrence score (4), predicts 5% 10 year distance recurrence risk with tamoxifen alone. Given the low risk of recurrence, adjuvant chemotherapy was not recommended. -She is on adjuvant letrozole, has developed moderate arthralgia with stiffness, we previously discussed switching her to exemestane or tamoxifen. She has had hysterectomy, opted to switch tamoxifen. I called into her pharmacy 03/22/16. --We stopped letrozole in August 2017 and we subsequently started her on Tamoxifen in October 2017. She is tolerating well, will continue for 10 years  -We'll continue breast cancer surveillance plan, Including annual mammogram, physical exam including breast exam every 6-12 months, and routine follow-up and lab test.   2. Arthralgia -She developed worsening moderate arthralgia, I encouraged her to exercise regularly, and take NSAIDs as needed. She can also try acupuncture if gets worse. -I advised her to stop letrozole in August 2017 and we then started  her on tamoxifen. -Now improved.  3. Hot flush -Started when she stopped hormonal replacement in November 2016. It has improved over time. Tolerable now  4. Left breast pain -Possible related to her bra prosthesis, and or possible letrozole  -I previously recommend her to use sports bra,  avoid using prosthesis for now  -She knows to use NSAIDs as needed  5. Osteopenia  -Her recent bone density scan in [place] showed osteopenia with a high risk for fracture -We previously discussed that letrozole will probably worsen her bone density, and we'll monitor her bone density every 2 years  -I encouraged her to continue calcium and vitamin D, she is very compliant  -I encouraged her to continue weightbearing exercise -She has started to prolia, tolerating well. Will continue every 6 months  6. Genetic -she was seen by genetic counselor in our cancer center, and her genetic testing for inheritable breast and ovarian syndrome panel was negative.  7 Hypertension and dyslipidemia -She will continue follow-up with her permit care physician  8. Right lower extremity edema -The patient stands all day as a Pharmacist, hospital. -She is on Tamoxifen and is taking baby aspirin. -Mild edema of the right lower extremity was noted on 11/16/16 and this was present for over a week. -Will order [place] lower extremities, it's scheduled to be done on 4/30  9. AKI -Cr increased to 1.4 today, previously 0.8 in 06/2016 -she does not appear to be dehydrated, I encourage her to drink more water, avoid dehydration and NSAIDs -I encourage her to follow up with PCP  -repeat lab in June   Plan -Continue Tamoxifen -US of the lower extremities on 11/19/16 in the afternoon. -We will request her outside mammograms from [facility]. -The patient will follow up with Dr. Excell Seltzer in June 2018. -Lab and f/u in December 2018. -Lab and prolia injection in June and December 2018.   All questions were answered. The patient knows to call the clinic with any problems, questions or concerns.  I spent 30  minutes counseling the patient face to face. The total time spent in the appointment was 40 minutes and more than 50% was on counseling.     Truitt Merle, MD 11/16/2016   This document serves as a record of services personally  performed by Truitt Merle, MD. It was created on her behalf by Darcus Austin, a trained medical scribe. The creation of this record is based on the scribe's personal observations and the provider's statements to them. This document has been checked and approved by the attending provider.

## 2016-11-17 ENCOUNTER — Encounter: Payer: Self-pay | Admitting: Hematology

## 2016-11-19 ENCOUNTER — Other Ambulatory Visit: Payer: Self-pay | Admitting: Hematology

## 2016-11-19 ENCOUNTER — Ambulatory Visit (HOSPITAL_COMMUNITY)
Admission: RE | Admit: 2016-11-19 | Discharge: 2016-11-19 | Disposition: A | Discharge status: Home / Self Care | Payer: 59 | Source: Ambulatory Visit | Attending: Hematology | Admitting: Hematology

## 2016-11-19 DIAGNOSIS — M79604 Pain in right leg: Secondary | ICD-10-CM

## 2016-11-19 DIAGNOSIS — M7989 Other specified soft tissue disorders: Secondary | ICD-10-CM | POA: Diagnosis present

## 2016-11-19 DIAGNOSIS — M79661 Pain in right lower leg: Secondary | ICD-10-CM

## 2016-11-19 NOTE — Progress Notes (Signed)
VASCULAR LAB PRELIMINARY  PRELIMINARY  PRELIMINARY  PRELIMINARY  Right lower extremity venous duplex completed.    Preliminary report:  There is no DVT or SVT noted in the right lower extremity.  Left message on Dr. Ernestina Penna nurses' voicemail at 16:07.  Gian Ybarra, RVT 11/19/2016, 4:06 PM

## 2017-01-18 ENCOUNTER — Other Ambulatory Visit: Payer: 59

## 2017-01-18 ENCOUNTER — Ambulatory Visit: Payer: 59

## 2017-02-01 ENCOUNTER — Other Ambulatory Visit: Payer: 59

## 2017-02-01 ENCOUNTER — Other Ambulatory Visit (HOSPITAL_BASED_OUTPATIENT_CLINIC_OR_DEPARTMENT_OTHER): Payer: 59

## 2017-02-01 ENCOUNTER — Ambulatory Visit (HOSPITAL_BASED_OUTPATIENT_CLINIC_OR_DEPARTMENT_OTHER): Payer: 59

## 2017-02-01 ENCOUNTER — Ambulatory Visit: Payer: 59

## 2017-02-01 ENCOUNTER — Telehealth: Payer: Self-pay | Admitting: *Deleted

## 2017-02-01 VITALS — BP 155/88 | HR 86 | Temp 97.7°F | Resp 18

## 2017-02-01 DIAGNOSIS — C50412 Malignant neoplasm of upper-outer quadrant of left female breast: Secondary | ICD-10-CM

## 2017-02-01 DIAGNOSIS — M8580 Other specified disorders of bone density and structure, unspecified site: Secondary | ICD-10-CM | POA: Diagnosis not present

## 2017-02-01 DIAGNOSIS — Z170 Estrogen receptor positive status [ER+]: Principal | ICD-10-CM

## 2017-02-01 LAB — CBC WITH DIFFERENTIAL/PLATELET
BASO%: 0.4 % (ref 0.0–2.0)
BASOS ABS: 0 10*3/uL (ref 0.0–0.1)
EOS ABS: 0 10*3/uL (ref 0.0–0.5)
EOS%: 0.6 % (ref 0.0–7.0)
HCT: 40.9 % (ref 34.8–46.6)
HEMOGLOBIN: 13.6 g/dL (ref 11.6–15.9)
LYMPH#: 1.4 10*3/uL (ref 0.9–3.3)
LYMPH%: 30.3 % (ref 14.0–49.7)
MCH: 30.2 pg (ref 25.1–34.0)
MCHC: 33.3 g/dL (ref 31.5–36.0)
MCV: 90.7 fL (ref 79.5–101.0)
MONO#: 0.4 10*3/uL (ref 0.1–0.9)
MONO%: 8.5 % (ref 0.0–14.0)
NEUT#: 2.8 10*3/uL (ref 1.5–6.5)
NEUT%: 60.2 % (ref 38.4–76.8)
NRBC: 0 % (ref 0–0)
PLATELETS: 239 10*3/uL (ref 145–400)
RBC: 4.51 10*6/uL (ref 3.70–5.45)
RDW: 12.9 % (ref 11.2–14.5)
WBC: 4.7 10*3/uL (ref 3.9–10.3)

## 2017-02-01 LAB — COMPREHENSIVE METABOLIC PANEL
ALBUMIN: 3.4 g/dL — AB (ref 3.5–5.0)
ALK PHOS: 39 U/L — AB (ref 40–150)
ALT: 22 U/L (ref 0–55)
AST: 18 U/L (ref 5–34)
Anion Gap: 9 mEq/L (ref 3–11)
BILIRUBIN TOTAL: 0.56 mg/dL (ref 0.20–1.20)
BUN: 15.9 mg/dL (ref 7.0–26.0)
CALCIUM: 8.8 mg/dL (ref 8.4–10.4)
CO2: 26 mEq/L (ref 22–29)
Chloride: 107 mEq/L (ref 98–109)
Creatinine: 1.1 mg/dL (ref 0.6–1.1)
EGFR: 55 mL/min/{1.73_m2} — AB (ref >90)
Glucose: 156 mg/dl — ABNORMAL HIGH (ref 70–140)
Potassium: 4 mEq/L (ref 3.5–5.1)
Sodium: 142 mEq/L (ref 136–145)
TOTAL PROTEIN: 6.3 g/dL — AB (ref 6.4–8.3)

## 2017-02-01 MED ORDER — TAMOXIFEN CITRATE 20 MG PO TABS: 20.0000 mg | ORAL_TABLET | Freq: Every day | ORAL | 1 refills | Status: DC

## 2017-02-01 MED ORDER — DENOSUMAB 60 MG/ML ~~LOC~~ SOLN
60.0000 mg | Freq: Once | SUBCUTANEOUS | Status: AC
  Administered 2017-02-01: 60 mg via SUBCUTANEOUS
  Filled 2017-02-01: qty 1

## 2017-02-01 NOTE — Telephone Encounter (Signed)
Patient checking in [facility] lobby for today's injection asking for prescription.    "I've moved to [place] and need to transfer medications to a different pharmacy.  I have five pills left.  Need a paper prescription to take to [facility] in [place]?"  Confirmed pharmacy location with patient.  Updated preferred pharmacy list and refill sent eRx.  Next F/U scheduled 07-05-2017.  No further questions or needs at this time.

## 2017-02-01 NOTE — Patient Instructions (Signed)
Denosumab injection  What is this medicine?  DENOSUMAB (den oh sue mab) slows bone breakdown. Prolia is used to treat osteoporosis in women after menopause and in men. Xgeva is used to prevent bone fractures and other bone problems caused by cancer bone metastases. Xgeva is also used to treat giant cell tumor of the bone.  This medicine may be used for other purposes; ask your health care provider or pharmacist if you have questions.  What should I tell my health care provider before I take this medicine?  They need to know if you have any of these conditions:  -dental disease  -eczema  -infection or history of infections  -kidney disease or on dialysis  -low blood calcium or vitamin D  -malabsorption syndrome  -scheduled to have surgery or tooth extraction  -taking medicine that contains denosumab  -thyroid or parathyroid disease  -an unusual reaction to denosumab, other medicines, foods, dyes, or preservatives  -pregnant or trying to get pregnant  -breast-feeding  How should I use this medicine?  This medicine is for injection under the skin. It is given by a health care professional in a hospital or clinic setting.  If you are getting Prolia, a special MedGuide will be given to you by the pharmacist with each prescription and refill. Be sure to read this information carefully each time.  For Prolia, talk to your pediatrician regarding the use of this medicine in children. Special care may be needed. For Xgeva, talk to your pediatrician regarding the use of this medicine in children. While this drug may be prescribed for children as young as 13 years for selected conditions, precautions do apply.  Overdosage: If you think you have taken too much of this medicine contact a poison control center or emergency room at once.  NOTE: This medicine is only for you. Do not share this medicine with others.  What if I miss a dose?  It is important not to miss your dose. Call your doctor or health care professional if you are  unable to keep an appointment.  What may interact with this medicine?  Do not take this medicine with any of the following medications:  -other medicines containing denosumab  This medicine may also interact with the following medications:  -medicines that suppress the immune system  -medicines that treat cancer  -steroid medicines like prednisone or cortisone  This list may not describe all possible interactions. Give your health care provider a list of all the medicines, herbs, non-prescription drugs, or dietary supplements you use. Also tell them if you smoke, drink alcohol, or use illegal drugs. Some items may interact with your medicine.  What should I watch for while using this medicine?  Visit your doctor or health care professional for regular checks on your progress. Your doctor or health care professional may order blood tests and other tests to see how you are doing.  Call your doctor or health care professional if you get a cold or other infection while receiving this medicine. Do not treat yourself. This medicine may decrease your body's ability to fight infection.  You should make sure you get enough calcium and vitamin D while you are taking this medicine, unless your doctor tells you not to. Discuss the foods you eat and the vitamins you take with your health care professional.  See your dentist regularly. Brush and floss your teeth as directed. Before you have any dental work done, tell your dentist you are receiving this medicine.  Do   not become pregnant while taking this medicine or for 5 months after stopping it. Women should inform their doctor if they wish to become pregnant or think they might be pregnant. There is a potential for serious side effects to an unborn child. Talk to your health care professional or pharmacist for more information.  What side effects may I notice from receiving this medicine?  Side effects that you should report to your doctor or health care professional as soon as  possible:  -allergic reactions like skin rash, itching or hives, swelling of the face, lips, or tongue  -breathing problems  -chest pain  -fast, irregular heartbeat  -feeling faint or lightheaded, falls  -fever, chills, or any other sign of infection  -muscle spasms, tightening, or twitches  -numbness or tingling  -skin blisters or bumps, or is dry, peels, or red  -slow healing or unexplained pain in the mouth or jaw  -unusual bleeding or bruising  Side effects that usually do not require medical attention (Report these to your doctor or health care professional if they continue or are bothersome.):  -muscle pain  -stomach upset, gas  This list may not describe all possible side effects. Call your doctor for medical advice about side effects. You may report side effects to FDA at 1-800-FDA-1088.  Where should I keep my medicine?  This medicine is only given in a clinic, doctor's office, or other health care setting and will not be stored at home.  NOTE: This sheet is a summary. It may not cover all possible information. If you have questions about this medicine, talk to your doctor, pharmacist, or health care provider.      2016, Elsevier/Gold Standard. (2012-01-07 12:37:47)

## 2017-06-22 ENCOUNTER — Telehealth: Payer: Self-pay | Admitting: Hematology

## 2017-06-22 NOTE — Telephone Encounter (Signed)
Spoke to patient regarding upcoming appointment updates in December per provider request.

## 2017-07-03 NOTE — Progress Notes (Signed)
Maria Larson  [phone] [phone]  Clinic follow Up Note   Patient Care Team: Leighton Ruff, MD as PCP - General (Family Medicine) Excell Seltzer, MD as Consulting Physician (General Surgery) Truitt Merle, MD as Consulting Physician (Hematology) Arloa Koh, MD as Consulting Physician (Radiation Oncology) Sylvan Cheese, NP as Nurse Practitioner (Hematology and Oncology) Gery Pray, MD as Consulting Physician (Radiation Oncology)   Date of Service:  07/04/2017  CHIEF COMPLAINTS:  Follow up left breast cancer   Oncology History   Breast cancer of upper-outer quadrant of left female breast [facility])   Staging form: Breast, AJCC 7th Edition     Clinical: Stage IIA (T2, N0, cM0(i+)) - Unsigned       Breast cancer of upper-outer quadrant of left female breast ([place])   06/21/2015 Mammogram    calcification on Mammogram, and a 3cm mass at 3:00 position of left breast on [place], axilla was negative on [place]       06/21/2015 Initial Biopsy    Left breast core needle biopsy showed invasive ductal carcinoma, grade 2, and DCIS      06/21/2015 Receptors her2    ER 100% positive, PR 100% positive, HER-2 negative, Ki-67 2%      06/21/2015 Clinical Stage    Stage IIA (T2 N0)      07/04/2015 Procedure    Breast/Ovarian panel: No clinically significant variants at ATM, BARD1, BRCA1, BRCA2, BRIP1, CDH1, CHEK2, EPCAM, FANCC, MLH1, MSH2, MSH6, NBN, PALB2, PMS2, PTEN, RAD51C, RAD51D, TP53, and XRCC2      07/27/2015 Surgery    left lumpectomy and SLN biopsy      07/27/2015 Pathology Results    left breast lumpectomy showed G1 IDC, 1.8cm,  (+) DCIS, margins negative. 0/1 LN. Repeat HER2/neu negative (ratio 1.54)      07/27/2015 Oncotype testing    RS 4, which predicts 5% 10-y distance recurence risk with Tamoxifen       07/27/2015 Pathologic Stage    Stage IA: pT1c pN0      08/31/2015 - 09/28/2015 Radiation Therapy    breast adjuvant  radiation: Left breast 42.72 cGy 16 fractions, lumpectomy cavity boost 10 gray      09/12/2015 Imaging    DEXA 09/12/2015  IMPRESSION:   Based on the WHO criteria this patient has osteopenia  Left femur neck T score -2.3, -13% change since February 2009   10 year probability of major  Osteoporotic fracture is 27% and hip fracture is 1.5%      10/2015 - 02/2016 Anti-estrogen oral therapy    Letrozole 2.5 mg daily. Stopped due to arthralgias      11/17/2015 Survivorship    Survivorship visit completed and copy given to patient.      04/2016 -  Anti-estrogen oral therapy    Tamoxifen 20 mg daily started in October 2017.       HISTORY OF PRESENTING ILLNESS:  Maria Larson 66 y.o. female is here because of her recently diagnosed left breast cancer. She is accompanied by her husband to our multidisciplinary breast clinic today.  The cancer was discovered by screening mammogram. She had ultrasound of breast for follow-up a left breast cyst 6 months ago which was negative except a 2.4 cm cyst. Her mammogram on 06/13/2015 showed a new area of calcification and ultrasound showed 2.2 cm irregular solid mass in the left breast at 3 clock position, which is new and suspicious for malignancy. She underwent ultrasound-guided core needle biopsy on 06/21/2015  which showed grade 2 invasive ductal carcinoma. ER/PR 100% positive, HER-2 negative.  She had no palpable breast mass before the biopsy. She tolerated the biopsy well, no complications. She feels well overall, denies any significant pain, dyspnea, GI or other symptoms. She is a current Pharmacist, hospital, still works full-time, she is active, bike 3-4 time a week for 20 months.   She had and it etopical pregnancy, was not able to conceive after infertility workup and treatment. She lives with her husband, they have a adopted son who is 46.   CURRENT THERAPY:  -Tamoxifen 20 mg daily started in October 2017.   INTERIM HISTORY:  Maria Larson returns for follow-up.  She was last seen by me 6 months ago. She presents to the clinic today noting she moved to [facility] in May. She has retired Patent examiner. Her son graduated from college and lives with her. She has been working out 3-4 times a week. She is doing PT twice a week for her sciatica  that started last week. She notes axillary soreness upon reaching and shooting pain from incision. This has worsened over the last month.  She denies lymphedema. The joint stiffness from letrozole is much better, but not completely resolved. This mostly occurs in the morning. She notes to having swelling in her lower legs, R>L. She denies having had surgery or injury to right leg. She will mostly sleep on right side but not continuously. The swelling she has had all fall. She denies pain from swelling. She will be having her mammogram and bone density tomorrow. She will also see Dr. Excell Seltzer tomorrow. She has been tolerating Prolia injections well. She has been tolerating Tamoxifen well with minimal hot flashes. She notes having low libido. She does have some vaginal dryness. She has adjusted her diet and will see her PCP next year.     MEDICAL HISTORY:  Past Medical History:  Diagnosis Date  . Breast cancer ([place])   . Breast cancer of upper-outer quadrant of left female breast ([place]) 06/24/2015  . Breast cancer of upper-outer quadrant of left female breast ([place]) 06/24/2015  . Family history of breast cancer   . History of hiatal hernia   . Hyperlipemia   . Hypertension   . Interstitial cystitis   . PONV (postoperative nausea and vomiting)   . Radiation 08/31/2015-09/28/2015   Left breast 42.72 cGy extending fractions, lumpectomy cavity boost 10 gray    SURGICAL HISTORY: Past Surgical History:  Procedure Laterality Date  . ABDOMINAL HYSTERECTOMY    . COLONOSCOPY W/ POLYPECTOMY    . ESOPHAGOGASTRODUODENOSCOPY ENDOSCOPY    . laproscopy     Multiple laparoscopy for infertility issue  . RADIOACTIVE SEED GUIDED PARTIAL  MASTECTOMY WITH AXILLARY SENTINEL LYMPH NODE BIOPSY Left 07/27/2015   Procedure: RADIOACTIVE SEED GUIDED LEFT LUMPECTOMY  WITH AXILLARY SENTINEL LYMPH NODE BIOPSY;  Surgeon: Excell Seltzer, MD;  Location: [facility];  Service: General;  Laterality: Left;    GYN HISTORY  Menarchal: 16 LMP: hysterectomy at 50  Contraceptive: none  HRT: yes, 14 years, stopped in 05/2015 G1P0:   SOCIAL HISTORY: Social History   Social History  . Marital Status: Married    Spouse Name: N/A  . Number of Children: One adopted sone who is 16   . Years of Education: N/A   Occupational History  . Kindergarten teacher   Social History Main Topics  . Smoking status: Never Smoker   . Smokeless tobacco: Not on file  . Alcohol Use: Yes  Comment: social   . Drug Use: No  . Sexual Activity: Not on file   Other Topics Concern  . Not on file   Social History Narrative    FAMILY HISTORY: Family History  Problem Relation Age of Onset  . Lung cancer Father   . Breast cancer Paternal Aunt        dx over 25  . Breast cancer Paternal Aunt        dx over 5s  . Breast cancer Paternal Aunt        dx over 11  . Kidney disease Maternal Grandfather   . Congestive Heart Failure Paternal Grandmother   . Colon cancer Paternal Aunt        dx in her 11s    ALLERGIES:  is allergic to sulfa antibiotics.  MEDICATIONS:  Current Outpatient Medications  Medication Sig Dispense Refill  . aspirin 81 MG tablet Take 81 mg by mouth daily.    . Calcium Carb-Cholecalciferol (CALCIUM 600+D) 600-800 MG-UNIT TABS Take 1 tablet by mouth daily.    . Calcium Glycerophosphate (PRELIEF PO) Take 1 tablet by mouth as needed.     . conjugated estrogens (PREMARIN) vaginal cream Place 1 Applicatorful vaginally once a week. Uses 1/2 gram twice weekly     . famotidine (PEPCID) 20 MG tablet Take 20 mg by mouth 2 (two) times daily.    . hyaluronate sodium (RADIAPLEXRX) GEL Apply 1 application topically 2 (two) times daily. Reported on  12/21/2015    . Meth-Hyo-M Bl-Na Phos-Ph Sal (URIBEL PO) Take 1 tablet by mouth as needed. Reported on 10/20/2015    . Multiple Vitamin (MULTIVITAMIN) tablet Take 1 tablet by mouth daily.    . NON FORMULARY Take 2 capsules by mouth daily. [place]  Super  Omega-3  Fish Oil.    . ramipril (ALTACE) 10 MG capsule Take 10 mg by mouth daily.    . rosuvastatin (CRESTOR) 10 MG tablet Take 10 mg by mouth daily.    . tamoxifen (NOLVADEX) 20 MG tablet Take 1 tablet (20 mg total) by mouth daily. 90 tablet 3   No current facility-administered medications for this visit.     REVIEW OF SYSTEMS:   Constitutional: Denies fevers, chills or abnormal night sweats (+) mild Hot flash  Eyes: Denies blurriness of vision, double vision or watery eyes Ears, nose, mouth, throat, and face: Denies mucositis or sore throat Respiratory: Denies cough, dyspnea or wheezes Cardiovascular: Denies palpitation, chest discomfort (+) occasional Lower leg swelling (R>L) Gastrointestinal:  Denies nausea, heartburn or change in bowel habits Reproductive: (+) vaginal dryness, low libido  Skin: Denies abnormal skin rashes Lymphatics: Denies new lymphadenopathy or easy bruising Neurological:Denies numbness, tingling or new weaknesses MSK: (+) Occasional discomfort when stretching her left arm. (+) sciatica pain  Breast: (+) occasional shooting pain in left breast  Behavioral/Psych: Mood is stable, no new changes  All other systems were reviewed with the patient and are negative.  PHYSICAL EXAMINATION: ECOG PERFORMANCE STATUS: 1  Vitals:   07/04/17 1522  BP: (!) 147/75  Pulse: 89  Resp: 18  Temp: 98.7 F (37.1 C)  SpO2: 100%   Filed Weights   07/04/17 1522  Weight: 170 lb 4.8 oz (77.2 kg)     GENERAL:alert, no distress and comfortable SKIN: skin color, texture, turgor are normal, no rashes or significant lesions EYES: normal, conjunctiva are pink and non-injected, sclera clear OROPHARYNX:no exudate, no erythema and  lips, buccal mucosa, and tongue normal  NECK: supple, thyroid normal  size, non-tender, without nodularity LYMPH:  no palpable lymphadenopathy in the cervical, axillary or inguinal LUNGS: clear to auscultation and percussion with normal breathing effort HEART: regular rate & rhythm and no murmurs (+) Mild edema of the right lower extremity up to the knee. No calf tenderness. ABDOMEN:abdomen soft, non-tender and normal bowel sounds Musculoskeletal:no cyanosis of digits and no clubbing  PSYCH: alert & oriented x 3 with fluent speech NEURO: no focal motor/sensory deficits Breasts: Breast inspection showed them to be symmetrical with no nipple discharge. The surgical incisions in the left axilla and around the left nipple has healed well, mild skin erythema and her mentation in the left breast. Palpation of the breasts and axilla revealed no obvious mass that I could appreciate.   LABORATORY DATA:  I have reviewed the data as listed CBC Latest Ref Rng & Units 07/04/2017 02/01/2017 11/16/2016  WBC 3.9 - 10.3 10e3/uL 6.9 4.7 5.4  Hemoglobin 11.6 - 15.9 g/dL 13.8 13.6 13.2  Hematocrit 34.8 - 46.6 % 41.9 40.9 39.9  Platelets 145 - 400 10e3/uL 246 239 221    CMP Latest Ref Rng & Units 07/04/2017 02/01/2017 11/16/2016  Glucose 70 - 140 mg/dl 136 156(H) 99  BUN 7.0 - 26.0 mg/dL 15.0 15.9 20.7  Creatinine 0.6 - 1.1 mg/dL 1.0 1.1 1.4(H)  Sodium 136 - 145 mEq/L 141 142 141  Potassium 3.5 - 5.1 mEq/L 4.0 4.0 3.8  Chloride 101 - 111 mmol/L - - -  CO2 22 - 29 mEq/L _0 Calcium 8.4 - 10.4 mg/dL 8.7 8.8 8.5  Total Protein 6.4 - 8.3 g/dL 6.3(L) 6.3(L) 6.2(L)  Total Bilirubin 0.20 - 1.20 mg/dL 0.68 0.56 0.75  Alkaline Phos 40 - 150 U/L 38(L) 39(L) 38(L)  AST 5 - 34 U/L _1 ALT 0 - 55 U/L _2 PATHOLOGY REPORT: Diagnosis 07/27/2015 1. Breast, lumpectomy, Left - INVASIVE DUCTAL CARCINOMA, GRADE 1/3, SPANNING 1.8 CM. - DUCTAL CARCINOMA IN SITU, LOW GRADE. - LYMPHOVASCULAR INVASION IS  IDENTIFIED. - INVASIVE CARCINOMA IS FOCALLY 0.1 CM TO THE ANTERIOR MARGIN OF SPECIMEN #1. - SEE ONCOLOGY TABLE BELOW. 2. Breast, excision, Additional inferior margin, left - FIBROCYSTIC CHANGES WITH USUAL DUCTAL HYPERPLASIA. - THERE IS NO EVIDENCE OF MALIGNANCY. - SEE COMMENT. 3. Breast, excision, Additional posterior margin, left - FIBROCYSTIC CHANGES WITH USUAL DUCTAL HYPERPLASIA. - THERE IS NO EVIDENCE OF MALIGNANCY. - SEE COMMENT. 4. Lymph node, sentinel, biopsy, Left axillary - THERE IS NO EVIDENCE OF CARCINOMA IN 1 OF 1 LYMPH NODE (0/1). 5. Fatty tissue, Left axillary tissue - BENIGN FIBROADIPOSE TISSUE. - LYMPH NODAL TISSUE IS NOT IDENTIFIED. - THERE IS NO EVIDENCE OF MALIGNANCY.  Microscopic Comment 1. BREAST, INVASIVE TUMOR, WITH LYMPH NODES PRESENT Specimen, including laterality and lymph node sampling (sentinel, non-sentinel): Left breast and left axillary lymph node. Procedure: Seed localized lumpectomy, additional margin resections, and lymph node resection x1. Histologic type: Ductal. Grade: 1. Tubule formation: 3. Nuclear pleomorphism: 1. Mitotic: 1. Tumor size (gross measurement): 1.8 cm. Margins: Invasive, distance to closest margin: Focally 0.1 cm to the anterior margin. In-situ, distance to closest margin: Greater than 0.2 cm to all margins. Lymphovascular invasion: Present. Ductal carcinoma in situ: Present. Grade: Low grade. Extensive intraductal component: Not identified. Lobular neoplasia: Not identified. Tumor focality: Unifocal. Treatment effect: N/A. Extent of tumor: Confined to breast parenchyma. Lymph nodes: Examined: 1 Sentinel 0 Non-sentinel 1 Total Lymph nodes with metastasis: 0. Breast prognostic profile: [id number]. Estrogen receptor: 100%,  strong staining intensity. Progesterone receptor: 100%, strong staining intensity. Her 2 neu: No amplification was determined. Her 2 neu by FISH will be repeated on the current case and  the results reported separately. Ki-67: 2%. Non-neoplastic breast: Fibrocystic changes with calcifications, usual ductal hyperplasia and healing biopsy site. TNM: pT1c, pN0. 2. , and 3. The surgical resection margin(s) of the specimen were inked and microscopically evaluated. (JBK:ds 07/28/15)  Oncotype RS 4, which predicts 5% 10-y distance recurrence risk with Tamoxifen   RADIOGRAPHIC STUDIES: I have personally reviewed the radiological images as listed and agreed with the findings in the report.  DEXA 09/12/2015  IMPRESSION:   Based on the WHO criteria this patient has osteopenia  Left femur neck T score -2.3, -13% change since February 2009   10 year probability of major  Osteoporotic fracture is 27% and hip fracture is 1.5%  ASSESSMENT & PLAN:  66 y.o. Caucasian postmenopausal female, with past medical history of hypertension,dyslipidemia, presented with screening discovered left breast cancer.  1. Breast cancer of upper outer quadrant of left breast, pT1cN0M0, stage Ia,  ER and PR 100% positive, HER-2 negative, Ki67 2%, (+) DCIS, low RS 4 --We discussed her surgical pathology findings in great details with patient and her husband. -I reviewed her Oncotype DX test results, which showed a low risk recurrence score (4), predicts 5% 10 year distance recurrence risk with tamoxifen alone. Given the low risk of recurrence, adjuvant chemotherapy was not recommended. -She was on adjuvant letrozole, has developed moderate arthralgia with stiffness, we previously discussed switching her to exemestane or tamoxifen. She has had hysterectomy, opted to switch tamoxifen. I called into her pharmacy 03/22/16. --We stopped letrozole in August 2017 and we subsequently started her on Tamoxifen in October 2017. She is tolerating well, will continue for 5-10 years  -We'll continue breast cancer surveillance plan, Including annual mammogram, physical exam including breast exam every 6-12 months, and routine  follow-up and lab test.  -She is clinically doing well. Lab reviewed, her CBC WNL, and CMP overall normal. Her physical exam was unremarkable. There is no clinical concern for recurrence. -She has moved to [facility] in 11/2016. She would like continue to be followed by me.  -Tolerating Tamoxifen well, has mild but tolerable hot flashes with virginal dryness. She is using vaginal cream once a week. She is also concern about her low libido from Tamoxifen, will refer her back to survivorship clinic next month to discuss  -Next mammogram scheduled for tomorrow  -F/u in 7 months    2. Arthralgia -She developed worsening moderate arthralgia, I encouraged her to exercise regularly, and take NSAIDs as needed. She can also try acupuncture if gets worse. -I advised her to stop letrozole in August 2017 and we then started her on tamoxifen in October 2017. -Much improved, now has stiffness when sitting in same positions for long periods of time.   3. Hot flush -Started when she stopped hormonal replacement in November 2016. It has improved over time. Tolerable now on Tamoxifen.   4. Left breast pain -Possible related to her bra prosthesis, and or possible letrozole  -I previously recommend her to use sports bra, avoid using prosthesis for now  -She knows to use NSAIDs as needed -She is currently seeing PT  5. Osteopenia  -Her 09/12/15 bone density scan in [place] showed osteopenia in the left femur neck (T-Score -2.30) with a high risk for fracture. -We previously discussed that letrozole will probably worsen her bone density, and we'll monitor  her bone density every 2 years  -I encouraged her to continue calcium and vitamin D, she is very compliant  -I encouraged her to continue weightbearing exercise -She has started to Prolia, tolerating well. Will continue every 6 months -Continue Vitamin D supplement  -Next Prolia injection in 07/2017.   6. Genetic -she was seen by genetic counselor in our cancer  center, and her genetic testing for inheritable breast and ovarian syndrome panel was negative.  7 Hypertension and dyslipidemia -She will continue follow-up with her permit care physician  8. Right lower extremity edema -The patient stands all day as a Pharmacist, hospital. -She is on Tamoxifen and is taking baby aspirin. -Mild edema of the right lower extremity was noted on 11/16/16 and this was present for over a week. -previously ordered US lower extremities  -I advised her to use compression socks and elevate legs when she sits.  -Will monitor.    Plan -Labs reviewed -Continue Tamoxifen, refilled today  -Mammogram tomorrow at [place] f/u to discuss low libido,  and Prolia injection in Jan, prefer 1/21 morning  -Lab, f/u and injection in 7 months    All questions were answered. The patient knows to call the clinic with any problems, questions or concerns.  I spent 25 minutes counseling the patient face to face. The total time spent in the appointment was 30 minutes and more than 50% was on counseling.     Truitt Merle, MD 07/04/2017   This document serves as a record of services personally performed by Truitt Merle, MD. It was created on her behalf by Joslyn Devon, a trained medical scribe. The creation of this record is based on the scribe's personal observations and the provider's statements to them.    I have reviewed the above documentation for accuracy and completeness, and I agree with the above.

## 2017-07-04 ENCOUNTER — Ambulatory Visit (HOSPITAL_BASED_OUTPATIENT_CLINIC_OR_DEPARTMENT_OTHER): Payer: 59 | Admitting: Hematology

## 2017-07-04 ENCOUNTER — Telehealth: Payer: Self-pay | Admitting: Hematology

## 2017-07-04 ENCOUNTER — Encounter: Payer: Self-pay | Admitting: Hematology

## 2017-07-04 ENCOUNTER — Other Ambulatory Visit (HOSPITAL_BASED_OUTPATIENT_CLINIC_OR_DEPARTMENT_OTHER): Payer: 59

## 2017-07-04 VITALS — BP 147/75 | HR 89 | Temp 98.7°F | Resp 18 | Ht 66.0 in | Wt 170.3 lb

## 2017-07-04 DIAGNOSIS — C50412 Malignant neoplasm of upper-outer quadrant of left female breast: Secondary | ICD-10-CM

## 2017-07-04 DIAGNOSIS — Z79810 Long term (current) use of selective estrogen receptor modulators (SERMs): Secondary | ICD-10-CM | POA: Diagnosis not present

## 2017-07-04 DIAGNOSIS — I10 Essential (primary) hypertension: Secondary | ICD-10-CM | POA: Diagnosis not present

## 2017-07-04 DIAGNOSIS — Z170 Estrogen receptor positive status [ER+]: Secondary | ICD-10-CM

## 2017-07-04 DIAGNOSIS — M8580 Other specified disorders of bone density and structure, unspecified site: Secondary | ICD-10-CM

## 2017-07-04 LAB — CBC WITH DIFFERENTIAL/PLATELET
BASO%: 0.3 % (ref 0.0–2.0)
Basophils Absolute: 0 10*3/uL (ref 0.0–0.1)
EOS%: 0.6 % (ref 0.0–7.0)
Eosinophils Absolute: 0 10*3/uL (ref 0.0–0.5)
HEMATOCRIT: 41.9 % (ref 34.8–46.6)
HEMOGLOBIN: 13.8 g/dL (ref 11.6–15.9)
LYMPH#: 1.9 10*3/uL (ref 0.9–3.3)
LYMPH%: 27.7 % (ref 14.0–49.7)
MCH: 29.9 pg (ref 25.1–34.0)
MCHC: 32.9 g/dL (ref 31.5–36.0)
MCV: 90.7 fL (ref 79.5–101.0)
MONO#: 0.5 10*3/uL (ref 0.1–0.9)
MONO%: 7 % (ref 0.0–14.0)
NEUT#: 4.4 10*3/uL (ref 1.5–6.5)
NEUT%: 64.4 % (ref 38.4–76.8)
Platelets: 246 10*3/uL (ref 145–400)
RBC: 4.62 10*6/uL (ref 3.70–5.45)
RDW: 13.1 % (ref 11.2–14.5)
WBC: 6.9 10*3/uL (ref 3.9–10.3)

## 2017-07-04 LAB — COMPREHENSIVE METABOLIC PANEL
ALK PHOS: 38 U/L — AB (ref 40–150)
ALT: 20 U/L (ref 0–55)
ANION GAP: 9 meq/L (ref 3–11)
AST: 19 U/L (ref 5–34)
Albumin: 3.3 g/dL — ABNORMAL LOW (ref 3.5–5.0)
BILIRUBIN TOTAL: 0.68 mg/dL (ref 0.20–1.20)
BUN: 15 mg/dL (ref 7.0–26.0)
CALCIUM: 8.7 mg/dL (ref 8.4–10.4)
CO2: 26 mEq/L (ref 22–29)
CREATININE: 1 mg/dL (ref 0.6–1.1)
Chloride: 106 mEq/L (ref 98–109)
EGFR: >60 mL/min/{1.73_m2} (ref >60)
Glucose: 136 mg/dl (ref 70–140)
Potassium: 4 mEq/L (ref 3.5–5.1)
Sodium: 141 mEq/L (ref 136–145)
TOTAL PROTEIN: 6.3 g/dL — AB (ref 6.4–8.3)

## 2017-07-04 MED ORDER — TAMOXIFEN CITRATE 20 MG PO TABS: 20.0000 mg | ORAL_TABLET | Freq: Every day | ORAL | 3 refills | Status: DC

## 2017-07-04 NOTE — Telephone Encounter (Signed)
Scheduled appt per 12/13 los - Gave patient aVS and calender per los.  

## 2017-07-05 ENCOUNTER — Ambulatory Visit: Payer: 59 | Admitting: Hematology

## 2017-07-05 ENCOUNTER — Other Ambulatory Visit: Payer: 59

## 2017-08-12 ENCOUNTER — Inpatient Hospital Stay: Payer: 59 | Attending: Adult Health

## 2017-08-12 VITALS — BP 174/89 | HR 81 | Temp 98.1°F | Resp 18

## 2017-08-12 DIAGNOSIS — M8580 Other specified disorders of bone density and structure, unspecified site: Secondary | ICD-10-CM | POA: Insufficient documentation

## 2017-08-12 MED ORDER — DENOSUMAB 60 MG/ML ~~LOC~~ SOLN
60.0000 mg | Freq: Once | SUBCUTANEOUS | Status: AC
  Administered 2017-08-12: 60 mg via SUBCUTANEOUS

## 2017-08-12 NOTE — Patient Instructions (Signed)

## 2017-08-13 ENCOUNTER — Ambulatory Visit: Payer: 59

## 2017-08-13 ENCOUNTER — Encounter: Payer: 59 | Admitting: Adult Health

## 2017-10-22 ENCOUNTER — Telehealth: Payer: Self-pay | Admitting: *Deleted

## 2017-10-22 NOTE — Telephone Encounter (Signed)
Pt called requesting change in appts. Pt would like to move appts until after September 15th d/t insurance. Request sent via inbasket

## 2017-10-25 ENCOUNTER — Telehealth: Payer: Self-pay | Admitting: Hematology

## 2017-10-25 NOTE — Telephone Encounter (Signed)
Appointments rescheduled calendar / lett mailed to patient. Per 4/2 sch msg

## 2018-02-03 ENCOUNTER — Ambulatory Visit: Payer: 59 | Admitting: Hematology

## 2018-02-03 ENCOUNTER — Ambulatory Visit: Payer: 59

## 2018-02-03 ENCOUNTER — Other Ambulatory Visit: Payer: 59

## 2018-03-31 ENCOUNTER — Telehealth: Payer: Self-pay | Admitting: Hematology

## 2018-03-31 NOTE — Telephone Encounter (Signed)
YF PAL - moved 9/16 lab/fu/inj to 9/24. Spoke with patient.

## 2018-04-07 ENCOUNTER — Other Ambulatory Visit: Payer: 59

## 2018-04-07 ENCOUNTER — Ambulatory Visit: Payer: 59 | Admitting: Hematology

## 2018-04-07 ENCOUNTER — Ambulatory Visit: Payer: 59

## 2018-04-10 NOTE — Progress Notes (Signed)
[place]  Telephone:([phone]) [phone] Fax:(336) [phone]  Clinic follow Up Note   Patient Care Team: Staci Righter, MD as PCP - General (Internal Medicine) Excell Seltzer, MD as Consulting Physician (General Surgery) Truitt Merle, MD as Consulting Physician (Hematology) Arloa Koh, MD as Consulting Physician (Radiation Oncology) Sylvan Cheese, NP as Nurse Practitioner (Hematology and Oncology) Gery Pray, MD as Consulting Physician (Radiation Oncology)   Date of Service:  04/15/2018  CHIEF COMPLAINTS:  Follow up left breast cancer   Oncology History   Breast cancer of upper-outer quadrant of left female breast [facility])   Staging form: Breast, AJCC 7th Edition     Clinical: Stage IIA (T2, N0, cM0(i+)) - Unsigned       Breast cancer of upper-outer quadrant of left female breast ([place])   06/21/2015 Mammogram    calcification on Mammogram, and a 3cm mass at 3:00 position of left breast on [place], axilla was negative on [place]     06/21/2015 Initial Biopsy    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 0/1 LN. Repeat HER2/neu negative (ratio 1.54)    07/27/2015 Oncotype testing    RS 4, which predicts 5% 10-y distance recurence risk with Tamoxifen     07/27/2015 Pathologic Stage    Stage IA: pT1c pN0    08/31/2015 - 09/28/2015 Radiation Therapy    breast adjuvant radiation: Left breast 42.72  cGy 16 fractions, lumpectomy cavity boost 10 gray    09/12/2015 Imaging    DEXA 09/12/2015  IMPRESSION:   Based on the WHO criteria this patient has osteopenia  Left femur neck T score -2.3, -13% change since February 2009   10 year probability of major  Osteoporotic fracture is 27% and hip fracture is 1.5%    10/2015 - 02/2016 Anti-estrogen oral therapy    Letrozole 2.5 mg daily. Stopped due to arthralgias    11/17/2015 Survivorship    Survivorship visit completed and copy given to patient.    04/2016 -  Anti-estrogen oral therapy    Tamoxifen 20 mg daily started in October 2017.     HISTORY OF PRESENTING ILLNESS:  Maria Larson 67 y.o. female is here because of her recently diagnosed left breast cancer. She is accompanied by her husband to our multidisciplinary breast clinic today.  The cancer was discovered by screening mammogram. She had ultrasound of breast for follow-up a left breast cyst 6 months ago which was negative except a 2.4 cm cyst. Her mammogram on 06/13/2015 showed a new area of calcification and ultrasound showed 2.2 cm irregular solid mass in the left breast at 3 clock position, which is new and suspicious for malignancy. She underwent ultrasound-guided core needle biopsy on 06/21/2015 which showed grade 2 invasive ductal carcinoma. ER/PR 100% positive, HER-2 negative.  She had no palpable breast mass before the biopsy. She tolerated the biopsy well,  no complications. She feels well overall, denies any significant pain, dyspnea, GI or other symptoms. She is a current Pharmacist, hospital, still works full-time, she is active, bike 3-4 time a week for 20 months.   She had and it etopical pregnancy, was not able to conceive after infertility workup and treatment. She lives with her husband, they have a adopted son who is 68.   CURRENT THERAPY:  -Tamoxifen 20 mg daily started in October 2017.   INTERIM HISTORY:  Maria Larson returns for follow-up. She was last seen by me 6 months ago. She is  here alone at the clinic. She is doing well and states that she's been having acid reflux. She had acid reflux before tamoxifen, and is currently on pepsin which relieves her. She states that her symptoms are worsening, especially when she bends over, and she is trying to reduce caffeine and acidic food intake. She previously took omeprazole for her acid reflux. She is worried about possible drug interactions with tamoxifen.  She states that she experiences left breast pain sometimes with left arm pain. She thinks that some of this pain could be related to her lifting heavy weights. She is very physically active and states that she still experiences vaginal dryness and low libido. She states the the vaginal cream is too expensive.   She lives in [place] now.     MEDICAL HISTORY:  Past Medical History:  Diagnosis Date  . Breast cancer ([place])   . Breast cancer of upper-outer quadrant of left female breast ([place]) 06/24/2015  . Breast cancer of upper-outer quadrant of left female breast ([place]) 06/24/2015  . Family history of breast cancer   . History of hiatal hernia   . Hyperlipemia   . Hypertension   . Interstitial cystitis   . PONV (postoperative nausea and vomiting)   . Radiation 08/31/2015-09/28/2015   Left breast 42.72 cGy extending fractions, lumpectomy cavity boost 10 gray    SURGICAL HISTORY: Past Surgical History:  Procedure Laterality Date  . ABDOMINAL HYSTERECTOMY    . COLONOSCOPY W/ POLYPECTOMY    . ESOPHAGOGASTRODUODENOSCOPY ENDOSCOPY    . laproscopy     Multiple laparoscopy for infertility issue  . RADIOACTIVE SEED GUIDED PARTIAL MASTECTOMY WITH AXILLARY SENTINEL LYMPH NODE BIOPSY Left 07/27/2015   Procedure: RADIOACTIVE SEED GUIDED LEFT LUMPECTOMY  WITH AXILLARY SENTINEL LYMPH NODE BIOPSY;  Surgeon: Excell Seltzer, MD;  Location: [facility];  Service: General;  Laterality: Left;    GYN HISTORY  Menarchal: 16 LMP: hysterectomy at 50  Contraceptive: none  HRT: yes, 14 years,  stopped in 05/2015 G1P0:   SOCIAL HISTORY: Social History   Social History  . Marital Status: Married    Spouse Name: N/A  . Number of Children: One adopted sone who is 62   . Years of Education: N/A   Occupational History  . Kindergarten teacher   Social History Main Topics  . Smoking status: Never Smoker   . Smokeless tobacco: Not on file  . Alcohol Use: Yes     Comment: social   . Drug Use: No  . Sexual Activity: Not on file   Other Topics Concern  . Not on file   Social History Narrative    FAMILY HISTORY: Family History  Problem Relation Age of Onset  . Lung cancer Father   . Breast cancer Paternal Aunt        dx over 40  . Breast cancer Paternal Aunt        dx over 6s  .  Breast cancer Paternal Aunt        dx over 39  . Kidney disease Maternal Grandfather   . Congestive Heart Failure Paternal Grandmother   . Colon cancer Paternal Aunt        dx in her 5s    ALLERGIES:  is allergic to sulfa antibiotics.  MEDICATIONS:  Current Outpatient Medications  Medication Sig Dispense Refill  . Calcium Carb-Cholecalciferol (CALCIUM 600+D) 600-800 MG-UNIT TABS Take 1 tablet by mouth daily.    . Calcium Glycerophosphate (PRELIEF PO) Take 1 tablet by mouth as needed.     . famotidine (PEPCID) 20 MG tablet Take 20 mg by mouth 2 (two) times daily.    . hyaluronate sodium (RADIAPLEXRX) GEL Apply 1 application topically 2 (two) times daily. Reported on 12/21/2015    . Multiple Vitamin (MULTIVITAMIN) tablet Take 1 tablet by mouth daily.    . ramipril (ALTACE) 10 MG capsule Take 10 mg by mouth daily.    . rosuvastatin (CRESTOR) 10 MG tablet Take 10 mg by mouth daily.    . tamoxifen (NOLVADEX) 20 MG tablet Take 1 tablet (20 mg total) by mouth daily. 90 tablet 3   No current facility-administered medications for this visit.     REVIEW OF SYSTEMS:   Constitutional: Denies fevers, chills or abnormal night sweats (+) mild Hot flash  Eyes: Denies blurriness of vision,  double vision or watery eyes Ears, nose, mouth, throat, and face: Denies mucositis or sore throat Respiratory: Denies cough, dyspnea or wheezes Cardiovascular: Denies palpitation, chest discomfort  Gastrointestinal:  Denies nausea, heartburn or change in bowel habits Reproductive: (+) history of fibroids  Skin: Denies abnormal skin rashes Lymphatics: Denies new lymphadenopathy or easy bruising Neurological:Denies numbness, tingling or new weaknesses MSK: (+)discomfort of her left arm after lifting heavy objects (+) occassional hip pain  Breast: (+) occasional shooting pain, tingling in left breast and left arm Behavioral/Psych: Mood is stable, no new changes  All other systems were reviewed with the patient and are negative.  PHYSICAL EXAMINATION: ECOG PERFORMANCE STATUS: 1  Vitals:   04/15/18 1312  BP: (!) 151/100  Pulse: 93  Resp: 18  Temp: 98 F (36.7 C)  SpO2: 96%   Filed Weights   04/15/18 1312  Weight: 174 lb (78.9 kg)     GENERAL:alert, no distress and comfortable SKIN: skin color, texture, turgor are normal, no rashes or significant lesions EYES: normal, conjunctiva are pink and non-injected, sclera clear OROPHARYNX:no exudate, no erythema and lips, buccal mucosa, and tongue normal  NECK: supple, thyroid normal size, non-tender, without nodularity LYMPH:  no palpable lymphadenopathy in the cervical, axillary or inguinal LUNGS: clear to auscultation and percussion with normal breathing effort HEART: regular rate & rhythm and no murmurs (+) Mild edema of the right lower extremity up to the knee. No calf tenderness. ABDOMEN:abdomen soft, non-tender and normal bowel sounds Musculoskeletal:no cyanosis of digits and no clubbing  PSYCH: alert & oriented x 3 with fluent speech NEURO: no focal motor/sensory deficits Breasts: Breast inspection showed them to be symmetrical with no nipple discharge. The surgical incisions in the left axilla and around the left nipple has healed  well without a significant scar tissue. Palpation of the breasts and axilla revealed mildly lumpy breasts but no obvious masses that I could appreciate.   LABORATORY DATA:  I have reviewed the data as listed CBC Latest Ref Rng & Units 04/15/2018 07/04/2017 02/01/2017  WBC 3.9 - 10.3 K/uL 6.9 6.9 4.7  Hemoglobin 11.6 - 15.9  g/dL 13.8 13.8 13.6  Hematocrit 34.8 - 46.6 % 41.7 41.9 40.9  Platelets 145 - 400 K/uL 254 246 239    CMP Latest Ref Rng & Units 04/15/2018 07/04/2017 02/01/2017  Glucose 70 - 99 mg/dL 84 136 156(H)  BUN 8 - 23 mg/dL 14 15.0 15.9  Creatinine 0.44 - 1.00 mg/dL 0.99 1.0 1.1  Sodium 135 - 145 mmol/L 145 141 142  Potassium 3.5 - 5.1 mmol/L 3.8 4.0 4.0  Chloride 98 - 111 mmol/L 108 - -  CO2 22 - 32 mmol/L '29 26 26  ' Calcium 8.9 - 10.3 mg/dL 8.7(L) 8.7 8.8  Total Protein 6.5 - 8.1 g/dL 6.5 6.3(L) 6.3(L)  Total Bilirubin 0.3 - 1.2 mg/dL 0.6 0.68 0.56  Alkaline Phos 38 - 126 U/L 43 38(L) 39(L)  AST 15 - 41 U/L '16 19 18  ' ALT 0 - 44 U/L '21 20 22     ' PATHOLOGY REPORT: Diagnosis 07/27/2015 1. Breast, lumpectomy, Left - INVASIVE DUCTAL CARCINOMA, GRADE 1/3, SPANNING 1.8 CM. - DUCTAL CARCINOMA IN SITU, LOW GRADE. - LYMPHOVASCULAR INVASION IS IDENTIFIED. - INVASIVE CARCINOMA IS FOCALLY 0.1 CM TO THE ANTERIOR MARGIN OF SPECIMEN #1. - SEE ONCOLOGY TABLE BELOW. 2. Breast, excision, Additional inferior margin, left - FIBROCYSTIC CHANGES WITH USUAL DUCTAL HYPERPLASIA. - THERE IS NO EVIDENCE OF MALIGNANCY. - SEE COMMENT. 3. Breast, excision, Additional posterior margin, left - FIBROCYSTIC CHANGES WITH USUAL DUCTAL HYPERPLASIA. - THERE IS NO EVIDENCE OF MALIGNANCY. - SEE COMMENT. 4. Lymph node, sentinel, biopsy, Left axillary - THERE IS NO EVIDENCE OF CARCINOMA IN 1 OF 1 LYMPH NODE (0/1). 5. Fatty tissue, Left axillary tissue - BENIGN FIBROADIPOSE TISSUE. - LYMPH NODAL TISSUE IS NOT IDENTIFIED. - THERE IS NO EVIDENCE OF MALIGNANCY.  Microscopic Comment 1. BREAST, INVASIVE  TUMOR, WITH LYMPH NODES PRESENT Specimen, including laterality and lymph node sampling (sentinel, non-sentinel): Left breast and left axillary lymph node. Procedure: Seed localized lumpectomy, additional margin resections, and lymph node resection x1. Histologic type: Ductal. Grade: 1. Tubule formation: 3. Nuclear pleomorphism: 1. Mitotic: 1. Tumor size (gross measurement): 1.8 cm. Margins: Invasive, distance to closest margin: Focally 0.1 cm to the anterior margin. In-situ, distance to closest margin: Greater than 0.2 cm to all margins. Lymphovascular invasion: Present. Ductal carcinoma in situ: Present. Grade: Low grade. Extensive intraductal component: Not identified. Lobular neoplasia: Not identified. Tumor focality: Unifocal. Treatment effect: N/A. Extent of tumor: Confined to breast parenchyma. Lymph nodes: Examined: 1 Sentinel 0 Non-sentinel 1 Total Lymph nodes with metastasis: 0. Breast prognostic profile: [id number]. Estrogen receptor: 100%, strong staining intensity. Progesterone receptor: 100%, strong staining intensity. Her 2 neu: No amplification was determined. Her 2 neu by FISH will be repeated on the current case and the results reported separately. Ki-67: 2%. Non-neoplastic breast: Fibrocystic changes with calcifications, usual ductal hyperplasia and healing biopsy site. TNM: pT1c, pN0. 2. , and 3. The surgical resection margin(s) of the specimen were inked and microscopically evaluated. (JBK:ds 07/28/15)  Oncotype RS 4, which predicts 5% 10-y distance recurrence risk with Tamoxifen   RADIOGRAPHIC STUDIES: I have personally reviewed the radiological images as listed and agreed with the findings in the report.  DEXA 09/12/2015  IMPRESSION:   Based on the WHO criteria this patient has osteopenia  Left femur neck T score -2.3, -13% change since February 2009   10 year probability of major  Osteoporotic fracture is 27% and hip fracture is 1.5%  ASSESSMENT  & PLAN:  67 y.o. Caucasian postmenopausal female, with past medical history  of hypertension,dyslipidemia, presented with screening discovered left breast cancer.  1. Breast cancer of upper outer quadrant of left breast, pT1cN0M0, stage Ia,  ER and PR 100% positive, HER-2 negative, Ki67 2%, (+) DCIS, low RS 4 --We discussed her surgical pathology findings in great details with patient and her husband. -I reviewed her Oncotype DX test results, which showed a low risk recurrence score (4), predicts 5% 10 year distance recurrence risk with tamoxifen alone. Given the low risk of recurrence, adjuvant chemotherapy was not recommended. -She was on adjuvant letrozole, has developed moderate arthralgia with stiffness, we previously discussed switching her to exemestane or tamoxifen. She has had hysterectomy, opted to switch tamoxifen. I called into her pharmacy 03/22/16. --We stopped letrozole in August 2017 and we subsequently started her on Tamoxifen in October 2017. She is tolerating well, will continue for 7-10 years  -We'll continue breast cancer surveillance plan, Including annual mammogram, physical exam including breast exam every 6-12 months, and routine follow-up and lab test.  -She has moved to [facility] in 11/2016. She would like continue to be followed by me.  -Tolerating Tamoxifen well, has mild but tolerable hot flashes with virginal dryness and low libido. -I discussed the option of clinical trail of Wellbutrin for low libido in breast cancer survivors, she is interested, will meet our research nurse today. -She is clinically doing well. Lab reviewed, her CBC WNL, and CMP showed Ca 8.7 which is low. Her physical exam was unremarkable. There is no clinical concern for recurrence. -Her last mammogram in November 2018 was negative.  She is scheduled to have next mammogram in November of this year. -She has been very physically active recently.  -I advised her to continue calcium twice daily, and  start taking vitamin D 800-1000u daily. -F/u in 6 months    2. Arthralgia -She developed worsening moderate arthralgia, I encouraged her to exercise regularly, and take NSAIDs as needed. She can also try acupuncture if gets worse. -I advised her to stop letrozole in August 2017 and we then started her on tamoxifen in October 2017. -Much improved, now has stiffness when sitting in same positions for long periods of time.   3. Hot flush -Started when she stopped hormonal replacement in November 2016. It has improved over time. Tolerable now on Tamoxifen.   4. Left breast pain -Possible related to her bra prosthesis, and or possible letrozole  -I previously recommend her to use sports bra, avoid using prosthesis for now  -She knows to use NSAIDs as needed -Overall tolerable  5. Osteopenia  -Her 09/12/15 bone density scan in [place] showed osteopenia in the left femur neck (T-Score -2.30) with a high risk for fracture. -We previously discussed that letrozole will probably worsen her bone density, and we'll monitor her bone density every 2 years  -I encouraged her to continue calcium and vitamin D, she is very compliant  -I encouraged her to continue weightbearing exercise -She has started to Prolia, tolerating well. Will continue every 6 months -She will check her vitamin D level with her primary care physician. -She had a repeat bone density scan this morning, results depending  6. Genetic -she was seen by genetic counselor in our cancer center, and her genetic testing for inheritable breast and ovarian syndrome panel was negative.  7 Hypertension and dyslipidemia -She will continue follow-up with her permit care physician    Plan -Labs reviewed, will give Prolia injection today -Continue Tamoxifen, refilled today  -Lab, f/u in 6 months  -She  will discuss with our research nurse  -She is scheduled for mammogram at [facility] in November  all questions were answered. The patient knows to  call the clinic with any problems, questions or concerns.  I spent 20 minutes counseling the patient face to face. The total time spent in the appointment was 25 minutes and more than 50% was on counseling.  Dierdre Searles Dweik am acting as scribe for Dr. Truitt Merle.  I have reviewed the above documentation for accuracy and completeness, and I agree with the above.      Truitt Merle, MD 04/15/2018

## 2018-04-15 ENCOUNTER — Inpatient Hospital Stay: Payer: Medicare Other

## 2018-04-15 ENCOUNTER — Telehealth: Payer: Self-pay | Admitting: Hematology

## 2018-04-15 ENCOUNTER — Inpatient Hospital Stay: Payer: Medicare Other | Admitting: Hematology

## 2018-04-15 ENCOUNTER — Other Ambulatory Visit: Payer: Self-pay | Admitting: Hematology

## 2018-04-15 ENCOUNTER — Inpatient Hospital Stay: Payer: Medicare Other | Attending: Hematology

## 2018-04-15 ENCOUNTER — Encounter: Payer: Self-pay | Admitting: *Deleted

## 2018-04-15 ENCOUNTER — Encounter: Payer: Self-pay | Admitting: Hematology

## 2018-04-15 VITALS — BP 151/100 | HR 93 | Temp 98.0°F | Resp 18 | Ht 66.0 in | Wt 174.0 lb

## 2018-04-15 DIAGNOSIS — M8580 Other specified disorders of bone density and structure, unspecified site: Secondary | ICD-10-CM | POA: Diagnosis not present

## 2018-04-15 DIAGNOSIS — Z170 Estrogen receptor positive status [ER+]: Secondary | ICD-10-CM | POA: Diagnosis not present

## 2018-04-15 DIAGNOSIS — I10 Essential (primary) hypertension: Secondary | ICD-10-CM

## 2018-04-15 DIAGNOSIS — Z79810 Long term (current) use of selective estrogen receptor modulators (SERMs): Secondary | ICD-10-CM | POA: Insufficient documentation

## 2018-04-15 DIAGNOSIS — C50412 Malignant neoplasm of upper-outer quadrant of left female breast: Secondary | ICD-10-CM | POA: Diagnosis not present

## 2018-04-15 DIAGNOSIS — E785 Hyperlipidemia, unspecified: Secondary | ICD-10-CM

## 2018-04-15 LAB — COMPREHENSIVE METABOLIC PANEL
ALK PHOS: 43 U/L (ref 38–126)
ALT: 21 U/L (ref 0–44)
AST: 16 U/L (ref 15–41)
Albumin: 3.4 g/dL — ABNORMAL LOW (ref 3.5–5.0)
Anion gap: 8 (ref 5–15)
BUN: 14 mg/dL (ref 8–23)
CALCIUM: 8.7 mg/dL — AB (ref 8.9–10.3)
CO2: 29 mmol/L (ref 22–32)
CREATININE: 0.99 mg/dL (ref 0.44–1.00)
Chloride: 108 mmol/L (ref 98–111)
GFR calc non Af Amer: 59 mL/min — ABNORMAL LOW (ref >60)
Glucose, Bld: 84 mg/dL (ref 70–99)
Potassium: 3.8 mmol/L (ref 3.5–5.1)
SODIUM: 145 mmol/L (ref 135–145)
Total Bilirubin: 0.6 mg/dL (ref 0.3–1.2)
Total Protein: 6.5 g/dL (ref 6.5–8.1)

## 2018-04-15 LAB — CBC WITH DIFFERENTIAL/PLATELET
BASOS PCT: 0 %
Basophils Absolute: 0 10*3/uL (ref 0.0–0.1)
EOS ABS: 0.1 10*3/uL (ref 0.0–0.5)
EOS PCT: 1 %
HCT: 41.7 % (ref 34.8–46.6)
HEMOGLOBIN: 13.8 g/dL (ref 11.6–15.9)
Lymphocytes Relative: 31 %
Lymphs Abs: 2.1 10*3/uL (ref 0.9–3.3)
MCH: 30.1 pg (ref 25.1–34.0)
MCHC: 33.1 g/dL (ref 31.5–36.0)
MCV: 91 fL (ref 79.5–101.0)
MONOS PCT: 7 %
Monocytes Absolute: 0.5 10*3/uL (ref 0.1–0.9)
NEUTROS PCT: 61 %
Neutro Abs: 4.2 10*3/uL (ref 1.5–6.5)
PLATELETS: 254 10*3/uL (ref 145–400)
RBC: 4.58 MIL/uL (ref 3.70–5.45)
RDW: 13.2 % (ref 11.2–14.5)
WBC: 6.9 10*3/uL (ref 3.9–10.3)

## 2018-04-15 MED ORDER — DENOSUMAB 60 MG/ML ~~LOC~~ SOLN
60.0000 mg | Freq: Once | SUBCUTANEOUS | Status: AC
  Administered 2018-04-15: 60 mg via SUBCUTANEOUS

## 2018-04-15 NOTE — Telephone Encounter (Signed)
Appts scheduled avs/calendar printed per 9/24 los °

## 2018-04-15 NOTE — Patient Instructions (Signed)
Denosumab injection  What is this medicine?  DENOSUMAB (den oh sue mab) slows bone breakdown. Prolia is used to treat osteoporosis in women after menopause and in men. Xgeva is used to prevent bone fractures and other bone problems caused by cancer bone metastases. Xgeva is also used to treat giant cell tumor of the bone.  This medicine may be used for other purposes; ask your health care provider or pharmacist if you have questions.  What should I tell my health care provider before I take this medicine?  They need to know if you have any of these conditions:  -dental disease  -eczema  -infection or history of infections  -kidney disease or on dialysis  -low blood calcium or vitamin D  -malabsorption syndrome  -scheduled to have surgery or tooth extraction  -taking medicine that contains denosumab  -thyroid or parathyroid disease  -an unusual reaction to denosumab, other medicines, foods, dyes, or preservatives  -pregnant or trying to get pregnant  -breast-feeding  How should I use this medicine?  This medicine is for injection under the skin. It is given by a health care professional in a hospital or clinic setting.  If you are getting Prolia, a special MedGuide will be given to you by the pharmacist with each prescription and refill. Be sure to read this information carefully each time.  For Prolia, talk to your pediatrician regarding the use of this medicine in children. Special care may be needed. For Xgeva, talk to your pediatrician regarding the use of this medicine in children. While this drug may be prescribed for children as young as 13 years for selected conditions, precautions do apply.  Overdosage: If you think you have taken too much of this medicine contact a poison control center or emergency room at once.  NOTE: This medicine is only for you. Do not share this medicine with others.  What if I miss a dose?  It is important not to miss your dose. Call your doctor or health care professional if you are  unable to keep an appointment.  What may interact with this medicine?  Do not take this medicine with any of the following medications:  -other medicines containing denosumab  This medicine may also interact with the following medications:  -medicines that suppress the immune system  -medicines that treat cancer  -steroid medicines like prednisone or cortisone  This list may not describe all possible interactions. Give your health care provider a list of all the medicines, herbs, non-prescription drugs, or dietary supplements you use. Also tell them if you smoke, drink alcohol, or use illegal drugs. Some items may interact with your medicine.  What should I watch for while using this medicine?  Visit your doctor or health care professional for regular checks on your progress. Your doctor or health care professional may order blood tests and other tests to see how you are doing.  Call your doctor or health care professional if you get a cold or other infection while receiving this medicine. Do not treat yourself. This medicine may decrease your body's ability to fight infection.  You should make sure you get enough calcium and vitamin D while you are taking this medicine, unless your doctor tells you not to. Discuss the foods you eat and the vitamins you take with your health care professional.  See your dentist regularly. Brush and floss your teeth as directed. Before you have any dental work done, tell your dentist you are receiving this medicine.  Do   not become pregnant while taking this medicine or for 5 months after stopping it. Women should inform their doctor if they wish to become pregnant or think they might be pregnant. There is a potential for serious side effects to an unborn child. Talk to your health care professional or pharmacist for more information.  What side effects may I notice from receiving this medicine?  Side effects that you should report to your doctor or health care professional as soon as  possible:  -allergic reactions like skin rash, itching or hives, swelling of the face, lips, or tongue  -breathing problems  -chest pain  -fast, irregular heartbeat  -feeling faint or lightheaded, falls  -fever, chills, or any other sign of infection  -muscle spasms, tightening, or twitches  -numbness or tingling  -skin blisters or bumps, or is dry, peels, or red  -slow healing or unexplained pain in the mouth or jaw  -unusual bleeding or bruising  Side effects that usually do not require medical attention (Report these to your doctor or health care professional if they continue or are bothersome.):  -muscle pain  -stomach upset, gas  This list may not describe all possible side effects. Call your doctor for medical advice about side effects. You may report side effects to FDA at 1-800-FDA-1088.  Where should I keep my medicine?  This medicine is only given in a clinic, doctor's office, or other health care setting and will not be stored at home.  NOTE: This sheet is a summary. It may not cover all possible information. If you have questions about this medicine, talk to your doctor, pharmacist, or health care provider.      2016, Elsevier/Gold Standard. (2012-01-07 12:37:47)

## 2018-04-22 ENCOUNTER — Telehealth: Payer: Self-pay | Admitting: *Deleted

## 2018-04-22 ENCOUNTER — Encounter: Payer: Self-pay | Admitting: *Deleted

## 2018-04-22 NOTE — Telephone Encounter (Signed)
I have called her back and discussed her DEXA result, which is improved compared 2 years ago, she will continue Prolia. We also discussed her left breast pain which is likely related to her breast surgery.   Truitt Merle MD

## 2018-04-22 NOTE — Telephone Encounter (Signed)
Received call from pt stating that she would like to have results of Bone Density done at [facility].  She reports that she is on prolia & wants to know if it has helped.  Message routed to Dr Burr Medico & result on her desk for review.

## 2018-06-12 ENCOUNTER — Other Ambulatory Visit: Payer: Self-pay

## 2018-06-12 DIAGNOSIS — Z170 Estrogen receptor positive status [ER+]: Principal | ICD-10-CM

## 2018-06-12 DIAGNOSIS — C50412 Malignant neoplasm of upper-outer quadrant of left female breast: Secondary | ICD-10-CM

## 2018-06-12 MED ORDER — TAMOXIFEN CITRATE 20 MG PO TABS: 20.0000 mg | ORAL_TABLET | Freq: Every day | ORAL | 3 refills | Status: DC

## 2018-07-08 ENCOUNTER — Encounter: Payer: Self-pay | Admitting: Hematology

## 2018-10-08 ENCOUNTER — Telehealth: Payer: Self-pay | Admitting: Hematology

## 2018-10-08 NOTE — Telephone Encounter (Signed)
Called pt per 3/18 sch message - unable to reach patient - left message for patient to call back to r/s

## 2018-10-15 ENCOUNTER — Other Ambulatory Visit: Payer: Medicare Other

## 2018-10-15 ENCOUNTER — Ambulatory Visit: Payer: Medicare Other | Admitting: Hematology

## 2018-12-10 ENCOUNTER — Telehealth: Payer: Self-pay | Admitting: Hematology

## 2018-12-10 NOTE — Telephone Encounter (Signed)
Added injection after 5/27 f/u per 5/17 schedule message. Left message for patient.

## 2018-12-12 NOTE — Progress Notes (Signed)
[place]   [phone] Fax:([phone]) [phone]   Clinic Follow up Note   Patient Care Team: Staci Righter, MD as PCP - General (Internal Medicine) Excell Seltzer, MD as Consulting Physician (General Surgery) Truitt Merle, MD as Consulting Physician (Hematology) Arloa Koh, MD as Consulting Physician (Radiation Oncology) Sylvan Cheese, NP as Nurse Practitioner (Hematology and Oncology) Gery Pray, MD as Consulting Physician (Radiation Oncology)  Date of Service:  12/17/2018  CHIEF COMPLAINT: Follow up left breast cancer   SUMMARY OF ONCOLOGIC HISTORY: Oncology History   Breast cancer of upper-outer quadrant of left female breast [facility])   Staging form: Breast, AJCC 7th Edition     Clinical: Stage IIA (T2, N0, cM0(i+)) - Unsigned       Breast cancer of upper-outer quadrant of left female breast ([place])   06/21/2015 Mammogram    calcification on Mammogram, and a 3cm mass at 3:00 position of left breast on [place], axilla was negative on [place]     06/21/2015 Initial Biopsy    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 0/1 LN. Repeat HER2/neu negative (ratio 1.54)    07/27/2015 Oncotype testing    RS 4, which predicts 5% 10-y distance recurence risk with Tamoxifen     07/27/2015 Pathologic Stage    Stage IA: pT1c pN0    08/31/2015 - 09/28/2015 Radiation Therapy    breast adjuvant  radiation: Left breast 42.72 cGy 16 fractions, lumpectomy cavity boost 10 gray    09/12/2015 Imaging    DEXA 09/12/2015  IMPRESSION:   Based on the WHO criteria this patient has osteopenia  Left femur neck T score -2.3, -13% change since February 2009   10 year probability of major  Osteoporotic fracture is 27% and hip fracture is 1.5%    10/2015 - 02/2016 Anti-estrogen oral therapy    Letrozole 2.5 mg daily. Stopped due to arthralgias    11/17/2015 Survivorship    Survivorship visit completed and copy given to patient.    04/2016 -  Anti-estrogen oral therapy    Tamoxifen 20 mg daily started in October 2017.      CURRENT THERAPY:  Tamoxifen 20 mg daily started in October 2017  INTERVAL HISTORY:  Maria Larson is here for a follow up of left breast cancer. She is here alone. She notes she is doing well. She has upper left arm and shoulder soreness which she attributes to recent yard work. She has full ROM and denies any lymphedema. She does notes right ankle swelling, she will use compression socks as needed, it resolves in the morning. She also notes joint pain with stiffness and tolerable back pain.  She notes her recent A1c was 6 and her PCP wants her to diet to bring it down.     REVIEW OF SYSTEMS:   Constitutional: Denies fevers, chills or abnormal weight loss  Eyes: Denies blurriness of vision Ears, nose, mouth, throat, and face: Denies mucositis or sore throat Respiratory: Denies cough, dyspnea or wheezes Cardiovascular: Denies palpitation, chest discomfort (+) Right ankle lower extremity swelling Gastrointestinal: Denies nausea, heartburn or change in bowel habits Skin: Denies abnormal skin rashes MKS: (+) joint stiffness in hips ans knees and tolerable back pain (+) Left upper arm and shoulder soreness Lymphatics: Denies new lymphadenopathy or easy bruising Neurological:Denies numbness, tingling or new weaknesses Behavioral/Psych: Mood is stable, no new changes  All other  systems were reviewed with the patient and are negative.  MEDICAL HISTORY:  Past Medical History:  Diagnosis Date   Breast cancer ([place])    Breast cancer of upper-outer quadrant of left female breast ([place]) 06/24/2015   Breast cancer of upper-outer quadrant of left female breast ([place]) 06/24/2015   Family history of breast cancer    History of hiatal hernia    Hyperlipemia    Hypertension    Interstitial cystitis    PONV (postoperative nausea and vomiting)    Radiation 08/31/2015-09/28/2015   Left breast 42.72 cGy extending fractions, lumpectomy cavity boost 10 gray    SURGICAL HISTORY: Past Surgical History:  Procedure Laterality Date   ABDOMINAL HYSTERECTOMY     COLONOSCOPY W/ POLYPECTOMY     ESOPHAGOGASTRODUODENOSCOPY ENDOSCOPY     laproscopy     Multiple laparoscopy for infertility issue   RADIOACTIVE SEED GUIDED PARTIAL MASTECTOMY WITH AXILLARY SENTINEL LYMPH NODE BIOPSY Left 07/27/2015   Procedure: RADIOACTIVE SEED GUIDED LEFT LUMPECTOMY  WITH AXILLARY SENTINEL LYMPH NODE BIOPSY;  Surgeon: Excell Seltzer, MD;  Location: [facility];  Service: General;  Laterality: Left;    I have reviewed the social history and family history with the patient and they are unchanged from previous note.  ALLERGIES:  is allergic to sulfa antibiotics.  MEDICATIONS:  Current Outpatient Medications  Medication Sig Dispense Refill   Calcium Carb-Cholecalciferol (CALCIUM 600+D) 600-800 MG-UNIT TABS Take 1 tablet by mouth daily.     Calcium Glycerophosphate (PRELIEF PO) Take 1 tablet by mouth as needed.      ciprofloxacin (CIPRO) 500 MG tablet Take 500 mg by mouth 2 (two) times daily.     famotidine (PEPCID) 20 MG tablet Take 20 mg by mouth 2 (two) times daily.     hyaluronate sodium (RADIAPLEXRX) GEL Apply 1 application topically 2 (two) times daily. Reported on 12/21/2015     Multiple Vitamin (MULTIVITAMIN) tablet Take 1 tablet by mouth daily.     ramipril (ALTACE) 10 MG capsule Take  10 mg by mouth daily.     rosuvastatin (CRESTOR) 10 MG tablet Take 10 mg by mouth daily.     tamoxifen (NOLVADEX) 20 MG tablet Take 1 tablet (20 mg total) by mouth daily. 90 tablet 3   No current facility-administered medications for this visit.     PHYSICAL EXAMINATION: ECOG PERFORMANCE STATUS: 0 - Asymptomatic  Vitals:   12/17/18 0931  BP: 137/87  Pulse: 94  Resp: 17  Temp: 98.7 F (37.1 C)  SpO2: 97%   Filed Weights   12/17/18 0931  Weight: 173 lb 4.8 oz (78.6 kg)    GENERAL:alert, no distress and comfortable SKIN: skin color, texture, turgor are normal, no rashes or significant lesions EYES: normal, Conjunctiva are pink and non-injected, sclera clear  NECK: supple, thyroid normal size, non-tender, without nodularity LYMPH:  no palpable lymphadenopathy in the cervical, axillary LUNGS: clear to auscultation and percussion with normal breathing effort HEART: regular rate & rhythm and no  murmurs and no lower extremity edema ABDOMEN:abdomen soft, non-tender and normal bowel sounds Musculoskeletal:no cyanosis of digits and no clubbing  NEURO: alert & oriented x 3 with fluent speech, no focal motor/sensory deficits BREAST: S/p left breast lumpectomy: surgical incision healed well (+) mild tenderness of left breast and axilla (+) No palpable breast mass or adenopathy   LABORATORY DATA:  I have reviewed the data as listed CBC Latest Ref Rng & Units 12/17/2018 04/15/2018 07/04/2017  WBC 4.0 - 10.5 K/uL 6.2 6.9 6.9  Hemoglobin 12.0 - 15.0 g/dL 13.7 13.8 13.8  Hematocrit 36.0 - 46.0 % 42.4 41.7 41.9  Platelets 150 - 400 K/uL 250 254 246     CMP Latest Ref Rng & Units 12/17/2018 04/15/2018 07/04/2017  Glucose 70 - 99 mg/dL 126(H) 84 136  BUN 8 - 23 mg/dL 15 14 15.0  Creatinine 0.44 - 1.00 mg/dL 0.91 0.99 1.0  Sodium 135 - 145 mmol/L 143 145 141  Potassium 3.5 - 5.1 mmol/L 3.8 3.8 4.0  Chloride 98 - 111 mmol/L 108 108 -  CO2 22 - 32 mmol/L '27 29 26  ' Calcium 8.9 - 10.3 mg/dL  8.7(L) 8.7(L) 8.7  Total Protein 6.5 - 8.1 g/dL 6.1(L) 6.5 6.3(L)  Total Bilirubin 0.3 - 1.2 mg/dL 0.7 0.6 0.68  Alkaline Phos 38 - 126 U/L 39 43 38(L)  AST 15 - 41 U/L '18 16 19  ' ALT 0 - 44 U/L '23 21 20      ' RADIOGRAPHIC STUDIES: I have personally reviewed the radiological images as listed and agreed with the findings in the report. No results found.   ASSESSMENT & PLAN:  ALIXANDRIA FRIEDT is a 68 y.o. female with   1. Breast cancer of upper outer quadrant of left breast, pT1cN0M0, stage Ia,  ER and PR 100% positive, HER-2 negative, Ki67 2%, (+) DCIS, low RS 4 -She was diagnosed in 05/2015. She is s/p left breast lumpectomy and radiation.  -I reviewed her Oncotype DX test results, which showed a low risk recurrence score (4), predicts 5% 10 year distance recurrence risk with tamoxifen alone. Given the low risk of recurrence, adjuvant chemotherapy was not recommended. -She started anti-estrogen therapy with letrozole, but due to arthralgia, we switched her to Tamoxifen in 04/2016. Tolerating well with manageable joint stiffness/pain.  -She is clinically doing well. Lab reviewed, her CBC and CMP are within normal limits except BG 126, Ca 8.7, protein 6.1, albumin 3.3. Her physical exam and her 06/2018 mammogram were unremarkable. There is no clinical concern for recurrence. -Will continue surveillance. Next mammogram 06/2019  -Continue Tamoxifen  -f/u on 07/10/2019    2. Arthralgia, left upper arm pain  -Her joint pain has improved off Letrozole. She is currently on Tamoxifen  -She has developed mild joint stiffness and tolerable back pain lately.  -She also has recent soreness in upper left arm and shoulder. This is likely muscular pain, I recommend she do PT with possible dry needling. I will give her referral.  -I recommend she try OTC arthritis medication such as glucosamine. I also encouraged her to continue to exercise and stretch.   3. Hot flush -Started when she stopped  hormonal replacement in November 2016. It has improved over time. Tolerable now on Tamoxifen.   4. Osteopenia  -She has started Prolia injection on 12/28/15, tolerating well.  -She is due for injection today, will continue every 6 months.  -I encouraged her to continue calcium, vitamin D and weightbearing exercise -Her 03/2018 DEXA showed  mildly improved osteopenia with the lowest T-score of -2.1 at left femur neck. Repeat every 2 years.   5. Genetic testing was negative, Cancer screening -She plans to have next colonoscopy in 2021.   6. Hypertension and dyslipidemia -She will continue follow-up with her permit care physician    Plan -Labs reviewed, correct Calcium in normal range, will give Prolia injection today -PT referral for left upper arm pain  -Continue Tamoxifen  -Lab, f/u, injection on 12/18 -Mammogram in 07/10/19    No problem-specific Assessment & Plan notes found for this encounter.   Orders Placed This Encounter  Procedures   MM DIAG BREAST TOMO BILATERAL    Standing Status:   Future    Standing Expiration Date:   12/17/2019    Scheduling Instructions:     [facility]    Order Specific Question:   Reason for Exam (SYMPTOM  OR DIAGNOSIS REQUIRED)    Answer:   screening    Order Specific Question:   Preferred imaging location?    Answer:   External   All questions were answered. The patient knows to call the clinic with any problems, questions or concerns. No barriers to learning was detected. I spent 20 minutes counseling the patient face to face. The total time spent in the appointment was 25 minutes and more than 50% was on counseling and review of test results     Truitt Merle, MD 12/17/2018   I, Maria Larson, am acting as scribe for Truitt Merle, MD.   I have reviewed the above documentation for accuracy and completeness, and I agree with the above.

## 2018-12-17 ENCOUNTER — Inpatient Hospital Stay: Payer: Medicare Other

## 2018-12-17 ENCOUNTER — Other Ambulatory Visit: Payer: Self-pay

## 2018-12-17 ENCOUNTER — Inpatient Hospital Stay (HOSPITAL_BASED_OUTPATIENT_CLINIC_OR_DEPARTMENT_OTHER): Payer: Medicare Other | Admitting: Hematology

## 2018-12-17 ENCOUNTER — Inpatient Hospital Stay: Payer: Medicare Other | Attending: Hematology

## 2018-12-17 ENCOUNTER — Encounter: Payer: Self-pay | Admitting: Hematology

## 2018-12-17 VITALS — BP 137/87 | HR 94 | Temp 98.7°F | Resp 17 | Ht 66.0 in | Wt 173.3 lb

## 2018-12-17 DIAGNOSIS — Z803 Family history of malignant neoplasm of breast: Secondary | ICD-10-CM | POA: Insufficient documentation

## 2018-12-17 DIAGNOSIS — E785 Hyperlipidemia, unspecified: Secondary | ICD-10-CM | POA: Insufficient documentation

## 2018-12-17 DIAGNOSIS — Z923 Personal history of irradiation: Secondary | ICD-10-CM | POA: Diagnosis not present

## 2018-12-17 DIAGNOSIS — C50412 Malignant neoplasm of upper-outer quadrant of left female breast: Secondary | ICD-10-CM | POA: Insufficient documentation

## 2018-12-17 DIAGNOSIS — Z79810 Long term (current) use of selective estrogen receptor modulators (SERMs): Secondary | ICD-10-CM

## 2018-12-17 DIAGNOSIS — I10 Essential (primary) hypertension: Secondary | ICD-10-CM | POA: Diagnosis not present

## 2018-12-17 DIAGNOSIS — Z79899 Other long term (current) drug therapy: Secondary | ICD-10-CM | POA: Diagnosis not present

## 2018-12-17 DIAGNOSIS — N951 Menopausal and female climacteric states: Secondary | ICD-10-CM | POA: Diagnosis not present

## 2018-12-17 DIAGNOSIS — M8580 Other specified disorders of bone density and structure, unspecified site: Secondary | ICD-10-CM | POA: Diagnosis not present

## 2018-12-17 DIAGNOSIS — Z170 Estrogen receptor positive status [ER+]: Secondary | ICD-10-CM | POA: Insufficient documentation

## 2018-12-17 LAB — CBC WITH DIFFERENTIAL/PLATELET
Abs Immature Granulocytes: 0.01 10*3/uL (ref 0.00–0.07)
Basophils Absolute: 0 10*3/uL (ref 0.0–0.1)
Basophils Relative: 1 %
Eosinophils Absolute: 0.1 10*3/uL (ref 0.0–0.5)
Eosinophils Relative: 1 %
HCT: 42.4 % (ref 36.0–46.0)
Hemoglobin: 13.7 g/dL (ref 12.0–15.0)
Immature Granulocytes: 0 %
Lymphocytes Relative: 30 %
Lymphs Abs: 1.8 10*3/uL (ref 0.7–4.0)
MCH: 29.6 pg (ref 26.0–34.0)
MCHC: 32.3 g/dL (ref 30.0–36.0)
MCV: 91.6 fL (ref 80.0–100.0)
Monocytes Absolute: 0.5 10*3/uL (ref 0.1–1.0)
Monocytes Relative: 8 %
Neutro Abs: 3.8 10*3/uL (ref 1.7–7.7)
Neutrophils Relative %: 60 %
Platelets: 250 10*3/uL (ref 150–400)
RBC: 4.63 MIL/uL (ref 3.87–5.11)
RDW: 12.7 % (ref 11.5–15.5)
WBC: 6.2 10*3/uL (ref 4.0–10.5)
nRBC: 0 % (ref 0.0–0.2)

## 2018-12-17 LAB — CMP (CANCER CENTER ONLY)
ALT: 23 U/L (ref 0–44)
AST: 18 U/L (ref 15–41)
Albumin: 3.3 g/dL — ABNORMAL LOW (ref 3.5–5.0)
Alkaline Phosphatase: 39 U/L (ref 38–126)
Anion gap: 8 (ref 5–15)
BUN: 15 mg/dL (ref 8–23)
CO2: 27 mmol/L (ref 22–32)
Calcium: 8.7 mg/dL — ABNORMAL LOW (ref 8.9–10.3)
Chloride: 108 mmol/L (ref 98–111)
Creatinine: 0.91 mg/dL (ref 0.44–1.00)
GFR, Est AFR Am: >60 mL/min (ref >60)
GFR, Estimated: >60 mL/min (ref >60)
Glucose, Bld: 126 mg/dL — ABNORMAL HIGH (ref 70–99)
Potassium: 3.8 mmol/L (ref 3.5–5.1)
Sodium: 143 mmol/L (ref 135–145)
Total Bilirubin: 0.7 mg/dL (ref 0.3–1.2)
Total Protein: 6.1 g/dL — ABNORMAL LOW (ref 6.5–8.1)

## 2018-12-17 MED ORDER — DENOSUMAB 60 MG/ML ~~LOC~~ SOLN
60.0000 mg | Freq: Once | SUBCUTANEOUS | Status: AC
  Administered 2018-12-17: 60 mg via SUBCUTANEOUS

## 2018-12-17 MED ORDER — DENOSUMAB 60 MG/ML ~~LOC~~ SOSY
PREFILLED_SYRINGE | SUBCUTANEOUS | Status: AC
  Filled 2018-12-17: qty 1

## 2018-12-17 NOTE — Patient Instructions (Signed)
Denosumab injection  What is this medicine?  DENOSUMAB (den oh sue mab) slows bone breakdown. Prolia is used to treat osteoporosis in women after menopause and in men. Xgeva is used to prevent bone fractures and other bone problems caused by cancer bone metastases. Xgeva is also used to treat giant cell tumor of the bone.  This medicine may be used for other purposes; ask your health care provider or pharmacist if you have questions.  What should I tell my health care provider before I take this medicine?  They need to know if you have any of these conditions:  -dental disease  -eczema  -infection or history of infections  -kidney disease or on dialysis  -low blood calcium or vitamin D  -malabsorption syndrome  -scheduled to have surgery or tooth extraction  -taking medicine that contains denosumab  -thyroid or parathyroid disease  -an unusual reaction to denosumab, other medicines, foods, dyes, or preservatives  -pregnant or trying to get pregnant  -breast-feeding  How should I use this medicine?  This medicine is for injection under the skin. It is given by a health care professional in a hospital or clinic setting.  If you are getting Prolia, a special MedGuide will be given to you by the pharmacist with each prescription and refill. Be sure to read this information carefully each time.  For Prolia, talk to your pediatrician regarding the use of this medicine in children. Special care may be needed. For Xgeva, talk to your pediatrician regarding the use of this medicine in children. While this drug may be prescribed for children as young as 13 years for selected conditions, precautions do apply.  Overdosage: If you think you have taken too much of this medicine contact a poison control center or emergency room at once.  NOTE: This medicine is only for you. Do not share this medicine with others.  What if I miss a dose?  It is important not to miss your dose. Call your doctor or health care professional if you are  unable to keep an appointment.  What may interact with this medicine?  Do not take this medicine with any of the following medications:  -other medicines containing denosumab  This medicine may also interact with the following medications:  -medicines that suppress the immune system  -medicines that treat cancer  -steroid medicines like prednisone or cortisone  This list may not describe all possible interactions. Give your health care provider a list of all the medicines, herbs, non-prescription drugs, or dietary supplements you use. Also tell them if you smoke, drink alcohol, or use illegal drugs. Some items may interact with your medicine.  What should I watch for while using this medicine?  Visit your doctor or health care professional for regular checks on your progress. Your doctor or health care professional may order blood tests and other tests to see how you are doing.  Call your doctor or health care professional if you get a cold or other infection while receiving this medicine. Do not treat yourself. This medicine may decrease your body's ability to fight infection.  You should make sure you get enough calcium and vitamin D while you are taking this medicine, unless your doctor tells you not to. Discuss the foods you eat and the vitamins you take with your health care professional.  See your dentist regularly. Brush and floss your teeth as directed. Before you have any dental work done, tell your dentist you are receiving this medicine.  Do   not become pregnant while taking this medicine or for 5 months after stopping it. Women should inform their doctor if they wish to become pregnant or think they might be pregnant. There is a potential for serious side effects to an unborn child. Talk to your health care professional or pharmacist for more information.  What side effects may I notice from receiving this medicine?  Side effects that you should report to your doctor or health care professional as soon as  possible:  -allergic reactions like skin rash, itching or hives, swelling of the face, lips, or tongue  -breathing problems  -chest pain  -fast, irregular heartbeat  -feeling faint or lightheaded, falls  -fever, chills, or any other sign of infection  -muscle spasms, tightening, or twitches  -numbness or tingling  -skin blisters or bumps, or is dry, peels, or red  -slow healing or unexplained pain in the mouth or jaw  -unusual bleeding or bruising  Side effects that usually do not require medical attention (Report these to your doctor or health care professional if they continue or are bothersome.):  -muscle pain  -stomach upset, gas  This list may not describe all possible side effects. Call your doctor for medical advice about side effects. You may report side effects to FDA at 1-800-FDA-1088.  Where should I keep my medicine?  This medicine is only given in a clinic, doctor's office, or other health care setting and will not be stored at home.  NOTE: This sheet is a summary. It may not cover all possible information. If you have questions about this medicine, talk to your doctor, pharmacist, or health care provider.      2016, Elsevier/Gold Standard. (2012-01-07 12:37:47)

## 2018-12-18 ENCOUNTER — Telehealth: Payer: Self-pay | Admitting: Hematology

## 2018-12-18 NOTE — Telephone Encounter (Signed)
Scheduled appt per 5/27 los.  A calendar will be mailed out.

## 2019-01-07 ENCOUNTER — Telehealth: Payer: Self-pay

## 2019-01-07 NOTE — Telephone Encounter (Signed)
Faxed signed orders back to [facility], sent to HIM for scan to chart.

## 2019-06-15 ENCOUNTER — Other Ambulatory Visit: Payer: Self-pay | Admitting: Hematology

## 2019-06-15 DIAGNOSIS — C50412 Malignant neoplasm of upper-outer quadrant of left female breast: Secondary | ICD-10-CM

## 2019-07-02 ENCOUNTER — Telehealth: Payer: Self-pay | Admitting: Hematology

## 2019-07-02 ENCOUNTER — Telehealth: Payer: Self-pay | Admitting: *Deleted

## 2019-07-02 NOTE — Telephone Encounter (Signed)
Pt called to cancel appts for 07/10/19 d/t covid concern. She will have diag mammo in [place] and them fax reports to Dr. Burr Medico. She will r/s appt next year. Dr. Burr Medico notified.

## 2019-07-02 NOTE — Telephone Encounter (Signed)
Called patient per providers request, left a voicemail. °

## 2019-07-03 ENCOUNTER — Telehealth: Payer: Self-pay

## 2019-07-03 NOTE — Telephone Encounter (Signed)
Patient calls requesting the order for Mammography be faxed to [place], [place], [place] to fax [phone].  This was faxed along with Dr. Ernestina Penna NPI number per her request.

## 2019-07-10 ENCOUNTER — Other Ambulatory Visit: Payer: Medicare Other

## 2019-07-10 ENCOUNTER — Ambulatory Visit: Payer: Medicare Other | Admitting: Hematology

## 2019-07-10 ENCOUNTER — Ambulatory Visit: Payer: Medicare Other

## 2022-01-14 LAB — EXTERNAL GENERIC LAB PROCEDURE: COLOGUARD: NEGATIVE
